# Patient Record
Sex: Male | Born: 1960 | Race: White | Hispanic: No | Marital: Single | Smoking: Never smoker
Health system: Southern US, Community
[De-identification: ages and names within clinical notes are randomized; demographics above are authoritative.]

## PROBLEM LIST (undated history)

## (undated) DIAGNOSIS — E785 Hyperlipidemia, unspecified: Secondary | ICD-10-CM

## (undated) DIAGNOSIS — N200 Calculus of kidney: Secondary | ICD-10-CM

## (undated) DIAGNOSIS — B20 Human immunodeficiency virus [HIV] disease: Secondary | ICD-10-CM

## (undated) HISTORY — DX: Human immunodeficiency virus (HIV) disease: B20

## (undated) HISTORY — PX: APPENDECTOMY: SHX54

## (undated) HISTORY — DX: Calculus of kidney: N20.0

## (undated) HISTORY — DX: Hyperlipidemia, unspecified: E78.5

---

## 2003-08-02 ENCOUNTER — Other Ambulatory Visit: Payer: Self-pay

## 2007-04-12 ENCOUNTER — Emergency Department: Payer: Self-pay | Admitting: Emergency Medicine

## 2007-04-16 ENCOUNTER — Ambulatory Visit: Payer: Self-pay | Admitting: Urology

## 2008-04-05 ENCOUNTER — Ambulatory Visit: Payer: Self-pay | Admitting: Urology

## 2008-05-28 DIAGNOSIS — B20 Human immunodeficiency virus [HIV] disease: Secondary | ICD-10-CM

## 2008-05-28 HISTORY — DX: Human immunodeficiency virus (HIV) disease: B20

## 2008-06-21 ENCOUNTER — Ambulatory Visit: Payer: Self-pay | Admitting: Urology

## 2008-06-23 ENCOUNTER — Ambulatory Visit: Payer: Self-pay | Admitting: Urology

## 2008-06-24 ENCOUNTER — Ambulatory Visit: Payer: Self-pay | Admitting: Urology

## 2008-07-01 ENCOUNTER — Ambulatory Visit: Payer: Self-pay | Admitting: Urology

## 2008-09-29 ENCOUNTER — Inpatient Hospital Stay: Payer: Self-pay | Admitting: Internal Medicine

## 2008-10-06 ENCOUNTER — Ambulatory Visit: Payer: Self-pay | Admitting: Urology

## 2008-10-11 ENCOUNTER — Encounter: Payer: Self-pay | Admitting: Infectious Diseases

## 2008-12-14 ENCOUNTER — Encounter: Payer: Self-pay | Admitting: Infectious Diseases

## 2008-12-14 LAB — CONVERTED CEMR LAB
CD4 Count: 290 microliters
CD4 T Helper %: 13.8 %
Cholesterol: 163 mg/dL
Glucose, Bld: 65 mg/dL
HIV 1 RNA Quant: <48 copies/mL
Hemoglobin: 15.3 g/dL

## 2009-03-16 ENCOUNTER — Encounter: Payer: Self-pay | Admitting: Infectious Diseases

## 2009-03-16 LAB — CONVERTED CEMR LAB
CD4 T Helper %: 17.6 %
Glucose, Bld: 88 mg/dL
HIV 1 RNA Quant: <20 copies/mL
Hemoglobin: 15.6 g/dL
WBC: 4.1 10*3/uL

## 2009-06-16 ENCOUNTER — Encounter: Payer: Self-pay | Admitting: Infectious Diseases

## 2009-06-16 LAB — CONVERTED CEMR LAB
CD4 T Helper %: 20.1 %
Platelets: 209 10*3/uL
WBC: 4.7 10*3/uL

## 2009-09-15 ENCOUNTER — Encounter: Payer: Self-pay | Admitting: Infectious Diseases

## 2009-09-15 LAB — CONVERTED CEMR LAB
HIV 1 RNA Quant: <20 copies/mL
Platelets: 248 10*3/uL

## 2009-10-05 ENCOUNTER — Ambulatory Visit: Payer: Self-pay | Admitting: Urology

## 2009-10-19 ENCOUNTER — Encounter: Payer: Self-pay | Admitting: Infectious Diseases

## 2009-10-25 ENCOUNTER — Encounter: Payer: Self-pay | Admitting: Infectious Diseases

## 2009-10-25 ENCOUNTER — Ambulatory Visit: Payer: Self-pay | Admitting: Infectious Diseases

## 2009-10-25 DIAGNOSIS — B20 Human immunodeficiency virus [HIV] disease: Secondary | ICD-10-CM | POA: Insufficient documentation

## 2009-10-25 LAB — CONVERTED CEMR LAB
ALT: 35 units/L (ref 0–35)
AST: 22 units/L (ref 0–37)
Basophils Absolute: 0 10*3/uL (ref 0.0–0.1)
Basophils Relative: 1 % (ref 0–1)
Cholesterol: 137 mg/dL (ref 0–200)
Creatinine, Ser: 1 mg/dL (ref 0.40–1.20)
Eosinophils Absolute: 0.1 10*3/uL (ref 0.0–0.7)
GC Probe Amp, Urine: NEGATIVE
HDL: 33 mg/dL — ABNORMAL LOW (ref >39)
HIV-2 Ab: NEGATIVE
HIV: REACTIVE
Hemoglobin, Urine: NEGATIVE
Hep B Core Total Ab: POSITIVE — AB
Hepatitis B Surface Ag: NEGATIVE
Leukocytes, UA: NEGATIVE
MCHC: 34.1 g/dL (ref 30.0–36.0)
MCV: 97.9 fL (ref 78.0–100.0)
Neutrophils Relative %: 52 % (ref 43–77)
Nitrite: NEGATIVE
Platelets: 192 10*3/uL (ref 150–400)
Protein, ur: NEGATIVE mg/dL
RDW: 12.3 % (ref 11.5–15.5)
Total Bilirubin: 0.4 mg/dL (ref 0.3–1.2)
Total CHOL/HDL Ratio: 4.2
VLDL: 54 mg/dL — ABNORMAL HIGH (ref 0–40)
WBC: 4 10*3/uL (ref 4.0–10.5)
pH: 6.5 (ref 5.0–8.0)

## 2009-10-26 DIAGNOSIS — N200 Calculus of kidney: Secondary | ICD-10-CM | POA: Insufficient documentation

## 2009-10-26 DIAGNOSIS — F329 Major depressive disorder, single episode, unspecified: Secondary | ICD-10-CM

## 2009-11-08 ENCOUNTER — Ambulatory Visit: Payer: Self-pay | Admitting: Infectious Diseases

## 2009-11-22 ENCOUNTER — Encounter (INDEPENDENT_AMBULATORY_CARE_PROVIDER_SITE_OTHER): Payer: Self-pay | Admitting: *Deleted

## 2010-02-07 ENCOUNTER — Ambulatory Visit: Payer: Self-pay | Admitting: Infectious Diseases

## 2010-02-07 LAB — CONVERTED CEMR LAB
ALT: 27 units/L (ref 0–35)
Albumin: 4.5 g/dL (ref 3.5–5.2)
CO2: 26 meq/L (ref 19–32)
Calcium: 9.4 mg/dL (ref 8.4–10.5)
Chloride: 103 meq/L (ref 96–112)
Eosinophils Relative: 2 % (ref 0–5)
Glucose, Bld: 82 mg/dL (ref 70–99)
HCT: 46.3 % — ABNORMAL HIGH (ref 36.0–46.0)
HIV 1 RNA Quant: <20 copies/mL (ref <20)
HIV-1 RNA Quant, Log: <1.3 (ref <1.30)
Lymphocytes Relative: 30 % (ref 12–46)
Lymphs Abs: 1.4 10*3/uL (ref 0.7–4.0)
Neutro Abs: 2.7 10*3/uL (ref 1.7–7.7)
Platelets: 203 10*3/uL (ref 150–400)
Potassium: 4.2 meq/L (ref 3.5–5.3)
Sodium: 139 meq/L (ref 135–145)
Total Bilirubin: 0.7 mg/dL (ref 0.3–1.2)
Total Protein: 7.9 g/dL (ref 6.0–8.3)
WBC: 4.7 10*3/uL (ref 4.0–10.5)

## 2010-02-20 ENCOUNTER — Ambulatory Visit: Payer: Self-pay | Admitting: Infectious Diseases

## 2010-02-20 ENCOUNTER — Encounter (INDEPENDENT_AMBULATORY_CARE_PROVIDER_SITE_OTHER): Payer: Self-pay | Admitting: *Deleted

## 2010-02-20 DIAGNOSIS — E785 Hyperlipidemia, unspecified: Secondary | ICD-10-CM | POA: Insufficient documentation

## 2010-06-19 ENCOUNTER — Ambulatory Visit
Admission: RE | Admit: 2010-06-19 | Discharge: 2010-06-19 | Payer: Self-pay | Source: Home / Self Care | Attending: Infectious Diseases | Admitting: Infectious Diseases

## 2010-06-19 ENCOUNTER — Encounter: Payer: Self-pay | Admitting: Infectious Diseases

## 2010-06-19 LAB — CONVERTED CEMR LAB
ALT: 36 units/L — ABNORMAL HIGH (ref 0–35)
AST: 25 units/L (ref 0–37)
Alkaline Phosphatase: 128 units/L — ABNORMAL HIGH (ref 39–117)
BUN: 15 mg/dL (ref 6–23)
Basophils Relative: 0 % (ref 0–1)
Creatinine, Ser: 1.02 mg/dL (ref 0.40–1.20)
HCT: 44.1 % (ref 36.0–46.0)
HDL: 36 mg/dL — ABNORMAL LOW (ref >39)
HIV-1 RNA Quant, Log: <1.3 (ref <1.30)
Hemoglobin: 15.3 g/dL — ABNORMAL HIGH (ref 12.0–15.0)
LDL Cholesterol: 71 mg/dL (ref 0–99)
MCV: 96.1 fL (ref 78.0–100.0)
Monocytes Relative: 11 % (ref 3–12)
Neutro Abs: 2.2 10*3/uL (ref 1.7–7.7)
Neutrophils Relative %: 49 % (ref 43–77)
Platelets: 185 10*3/uL (ref 150–400)
RDW: 12.4 % (ref 11.5–15.5)
Total Bilirubin: 0.4 mg/dL (ref 0.3–1.2)
Triglycerides: 195 mg/dL — ABNORMAL HIGH (ref <150)

## 2010-06-27 NOTE — Miscellaneous (Signed)
Summary: Orders Update  Clinical Lists Changes  Orders: Added new Service order of Influenza Vaccine NON MCR (60454) - Signed Added new Service order of Pneumococcal Vaccine (09811) - Signed Added new Service order of Admin 1st Vaccine (91478) - Signed Added new Service order of Admin 1st Vaccine [facility]) [phone]) - Signed Observations: Added new observation of PNEUMOVAXVIS: 05/02/09 version given February 20, 2010. (02/20/2010 9:38) Added new observation of PNEUMOVAXLOT: [id number] (02/20/2010 9:38) Added new observation of PNEUMOVAXEXP: 08/12/2011 (02/20/2010 9:38) Added new observation of PNEUMOVAXBY: Kathi Simpers [facility]) (02/20/2010 9:38) Added new observation of PNEUMOVAXRTE: IM (02/20/2010 9:38) Added new observation of PNEUMOVAXMFR: Merck (02/20/2010 9:38) Added new observation of PNEUMOVAXSIT: left deltoid (02/20/2010 9:38) Added new observation of PNEUMOVAX: Pneumovax (02/20/2010 9:38) Added new observation of FLU VAX#1VIS: 12/20/09 version given February 20, 2010. (02/20/2010 9:38) Added new observation of FLU VAXLOT: 11033P (02/20/2010 9:38) Added new observation of FLU VAX EXP: 08/27/2010 (02/20/2010 9:38) Added new observation of FLU VAXBY: Kathi Simpers [facility]) (02/20/2010 9:38) Added new observation of FLU VAXRTE: IM (02/20/2010 9:38) Added new observation of FLU VAX DSE: 0.5 ml (02/20/2010 9:38) Added new observation of FLU VAXMFR: Novartis (02/20/2010 9:38) Added new observation of FLU VAX SITE: right deltoid (02/20/2010 9:38) Added new observation of FLU VAX: Fluvax Non-MCR (02/20/2010 9:38)      Pneumovax Vaccine    Vaccine Type: Pneumovax    Site: left deltoid    Mfr: Merck    Dose: 0.5 ml    Route: IM    Given by: Kathi Simpers CMA(AAMA)    Exp. Date: 08/12/2011    Lot #: [id number]    VIS given: 05/02/09 version given February 20, 2010.  Influenza Vaccine    Vaccine Type: Fluvax Non-MCR    Site: right deltoid    Mfr: Novartis    Dose: 0.5 ml    Route: IM  Given by: Kathi Simpers CMA(AAMA)    Exp. Date: 08/27/2010    Lot #: [id number]    VIS given: 12/20/09 version given February 20, 2010.  Flu Vaccine Consent Questions    Do you have a history of severe allergic reactions to this vaccine? no    Any prior history of allergic reactions to egg and/or gelatin? no    Do you have a sensitivity to the preservative Thimersol? no    Do you have a past history of Guillan-Barre Syndrome? no    Do you currently have an acute febrile illness? no    Have you ever had a severe reaction to latex? no    Vaccine information given and explained to patient? yes    Are you currently pregnant? no

## 2010-06-27 NOTE — Miscellaneous (Signed)
Summary: clinical update/ryan white  Clinical Lists Changes  Observations: Added new observation of PAYOR: Private (11/22/2009 8:52) Added new observation of AIDSDAP: Pending (11/22/2009 8:52) Added new observation of PCTFPL: 94.63  (11/22/2009 8:52) Added new observation of INCOMESOURCE: Unemployment  (11/22/2009 8:52) Added new observation of HOUSEINCOME: 16109  (11/22/2009 8:52) Added new observation of #CHILD<18 IN: No  (11/22/2009 8:52) Added new observation of FAMILYSIZE: 1  (11/22/2009 8:52) Added new observation of HOUSING: Stable/permanent  (11/22/2009 8:52) Added new observation of FINASSESSDT: 11/22/2009  (11/22/2009 8:52) Added new observation of YEARLYEXPEN: 360  (11/22/2009 8:52) Added new observation of HIV INIT DX: 09/27/2008  (11/22/2009 8:52) Added new observation of HIV RISK BEH: MSM  (11/22/2009 8:52) Added new observation of GENDER: Male  (11/22/2009 8:52) Added new observation of LATINO/HISP: No  (11/22/2009 8:52) Added new observation of RACE: White  (11/22/2009 8:52) Added new observation of REC_MESSAGE: Yes  (11/22/2009 8:52) Added new observation of RECPHONECALL: Yes  (11/22/2009 8:52) Added new observation of REC_MAIL: Yes  (11/22/2009 8:52) Added new observation of RW VITAL STA: Active  (11/22/2009 8:52) Added new observation of PATNTCOUNTY: [place]  (11/22/2009 8:52) Added new observation of RWPARTICIP: Yes  (11/22/2009 8:52)

## 2010-06-27 NOTE — Miscellaneous (Signed)
Summary: Orders Update  Clinical Lists Changes  Orders: Added new Test order of T-Comprehensive Metabolic Panel ([id number]) - Signed Added new Test order of T-CBC w/Diff (85025-10010) - Signed Added new Test order of T-CD4SP ([facility]) (CD4SP) - Signed Added new Test order of T-HIV Viral Load (87536-83319) - Signed 

## 2010-06-27 NOTE — Assessment & Plan Note (Signed)
Summary: NEW 042/   CC:  New patient.  History of Present Illness: 50 yo M with hx of AIDS dx 08-2008 in [facility] (CD4 84).He thought he had "flu"  at the time. It progressed and he was thought to have kidney stone. He had what sounds like bacteremia due to this and he had a HIV+ test then.   He was seen and followed by M. Blocker and now transfers here for care. He was started on atripla and bactrim. He has otherwise felt well.  CD4 280 and VL <48, Trig 269 (10-25-09)  Preventive Screening-Counseling & Management  Alcohol-Tobacco     Alcohol drinks/day: 0     Smoking Status: never  Caffeine-Diet-Exercise     Caffeine use/day: tea     Does Patient Exercise: yes     Type of exercise: walking 2.6 miles     Exercise (avg: min/session): 30-60     Times/week: 3  Safety-Violence-Falls     Seat Belt Use: yes      Drug Use:  no.     Current Allergies (reviewed today): ! CODEINE Past History:  Past Medical History: Nephrolithiasis Depression/Panic Attacks AIDS dx 08-2008  Current Medications (verified): 1)  Atripla 600-200-300 Mg Tabs (Efavirenz-Emtricitab-Tenofovir) .... Take 1 Tablet By Mouth At Bedtime 2)  Effexor Xr 75 Mg Xr24h-Cap (Venlafaxine Hcl) .... Take 1 Capsule By Mouth Once A Day Per Dr. Leavy Cella  Allergies: 1)  ! 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         has started to work out at then gym to try and lose wt. walking 13miles/day. oocas sleep walking. No panic atacks x 2 years  Vital Signs:  Patient profile:   50 year old male Height:      70 inches (177.80 cm) Weight:      214.12 pounds (97.33 kg) BMI:     30.83 Temp:     98.3 degrees F (36.83 degrees C) oral Pulse rate:   73 / minute BP sitting:   122 / 84  (left  arm)  Vitals Entered By: Baxter Hire) (November 08, 2009 2:01 PM) CC: New patient Pain Assessment Patient in pain? no      Nutritional Status Detail appetite is good per patient  Have you ever been in a relationship where you felt threatened, hurt or afraid?Unable to ask-friend in the room   Does patient need assistance? Functional Status Self care Ambulation Normal        Medication Adherence: 11/08/2009   Adherence to medications reviewed with patient. Counseling to provide adequate adherence provided   Prevention For Positives: 11/08/2009   Safe sex practices discussed with patient. Condoms offered.                             Physical Exam  General:  well-developed, well-nourished, and well-hydrated.   Eyes:  pupils equal, pupils round, and pupils reactive to light.  some irreguarity of R cornea from prev laser surgery. Mouth:  pharynx pink and moist and no exudates.   Neck:  no masses.   Lungs:  normal respiratory effort and normal breath sounds.   Heart:  normal rate, regular rhythm, and no murmur.   Abdomen:  soft, non-tender, and normal bowel sounds.  Extremities:  no edema Neurologic:  sensation intact to light touch.     Impression & Recommendations:  Problem # 1:  HIV INFECTION (ICD-042)  He is doing well, kudos to Dr Leavy Cella. He is looking to transfer his care to a [employer] provider given that he has lost his job. I will have him meet with our ADAP coordinator as well as have him plugged into the case managers here. He is offered condoms. Will offer him the Hepatitis A series.  Will return to clinic 3 months.   Orders: Consultation Level IV (84132)  Problem # 2:  DEPRESSION (ICD-311) this appears to be well controlled. He can f/u with Dr Judithann Sheen or here, will leave it up to pt.  Her updated medication list for this problem includes:    Effexor Xr 75 Mg Xr24h-cap (Venlafaxine hcl) .Marland Kitchen... Take 1 capsule by mouth once a day per dr.  blocker  Medications Added to Medication List This Visit: 1)  Atripla 600-200-300 Mg Tabs (Efavirenz-emtricitab-tenofovir) .... Take 1 tablet by mouth at bedtime   Appended Document: Orders Update    Clinical Lists Changes  Orders: Added new Service order of Hepatitis A Vaccine (Adult Dose) (44010) - Signed Added new Service order of Admin 1st Vaccine (27253) - Signed Added new Service order of Admin 1st Vaccine [facility]) [phone]) - Signed Observations: Added new observation of HEPAVAX#1VIS: 08/15/04 version given November 08, 2009. (11/08/2009 16:39) Added new observation of HEPAVAX#1LOT: [id number] (11/08/2009 16:39) Added new observation of HEPAVAX#1EXP: 07/30/2011 (11/08/2009 16:39) Added new observation of HEPAVAX#1 BY: Kathi Simpers [facility]) (11/08/2009 16:39) Added new observation of HEPAVAX#1RTE: IM (11/08/2009 16:39) Added new observation of HEPAVAX#1DOS: 1.0 ml (11/08/2009 16:39) Added new observation of HEPAVAX#1MFR: Havrix (11/08/2009 16:39) Added new observation of HEPAVAX#1SIT: right deltoid (11/08/2009 16:39) Added new observation of HEPAVAX #1: HepA (11/08/2009 16:39)       Hepatitis A Vaccine # 1    Vaccine Type: HepA    Site: right deltoid    Mfr: Havrix    Dose: 1.0 ml    Route: IM    Given by: Kathi Simpers CMA(AAMA)    Exp. Date: 07/30/2011    Lot #: [id number]    VIS given: 08/15/04 version given November 08, 2009.

## 2010-06-27 NOTE — Assessment & Plan Note (Signed)
Summary: F/U/VS   CC:  3 month follow up.  History of Present Illness: 50 yo M with hx of hyperlipidemia and AIDS dx 08-2008 in [facility] (CD4 84). He thought he had "flu" at the time. It progressed and he was thought to have kidney stone. He had what sounds like bacteremia due to this and he had a HIV+ test then.   He was seen and followed by M. Blocker and now transfers here for care. He was started on atripla and bactrim.  feeling well. occas pain in his R 3rd digit in AM. pops. no hx of truama.  eventually it loosesns up but still pops all the time. was tapered off effexor but then restarted after he had a panic episode.  CD4 350  and VL <20, Cr 1.27 (02-07-10)  Preventive Screening-Counseling & Management  Alcohol-Tobacco     Alcohol drinks/day: 0     Smoking Status: never  Caffeine-Diet-Exercise     Caffeine use/day: tea     Does Patient Exercise: yes     Type of exercise: walking 2.6 miles     Exercise (avg: min/session): 30-60     Times/week: 3  Safety-Violence-Falls     Seat Belt Use: yes   Updated Prior Medication List: ATRIPLA 600-200-300 MG TABS (EFAVIRENZ-EMTRICITAB-TENOFOVIR) Take 1 tablet by mouth at bedtime EFFEXOR XR 75 MG XR24H-CAP (VENLAFAXINE HCL) Take 1 capsule by mouth once a day per Dr. Leavy Cella  Current Allergies (reviewed today): ! CODEINE Past History:  Past medical, surgical, family and social histories (including risk factors) reviewed, and no changes noted (except as noted below).  Past Medical History: Reviewed history from 11/08/2009 and no changes required. Nephrolithiasis Depression/Panic Attacks AIDS dx 08-2008  Family History: Reviewed history from 11/08/2009 and no changes required. CAD- maternal-grandfather, uncle,   Social History: Reviewed history from 11/08/2009 and no changes required. Domestic Partner Never Smoked Alcohol use-rare Drug use-no Occupation: Engineer, water  Review of Systems       walking every  day, not eating junk food only sort of watching what he eats. nl BM, nl urination  Vital Signs:  Patient profile:   50 year old male Height:      70 inches (177.80 cm) Weight:      215.8 pounds (98.09 kg) BMI:     31.08 Temp:     97.9 degrees F (36.61 degrees C) oral Pulse rate:   73 / minute BP sitting:   106 / 76  (left arm)  Vitals Entered By: Kathi Simpers CMA(AAMA) (February 20, 2010 9:00 AM) CC: 3 month follow up Pain Assessment Patient in pain? yes     Location: right hand Intensity: 6 Type: aching Onset of pain  pain only happens in the morning and makes a popping sound Nutritional Status BMI of > 30 = obese Nutritional Status Detail appetite is great per patient  Have you ever been in a relationship where you felt threatened, hurt or afraid?No   Does patient need assistance? Functional Status Self care Ambulation Normal   Physical Exam  General:  well-developed, well-nourished, well-hydrated, and overweight-appearing.   Eyes:  pupils equal, pupils round, and pupils reactive to light.   Mouth:  pharynx pink and moist and no exudates.   Neck:  no masses.   Lungs:  normal respiratory effort and normal breath sounds.   Heart:  normal rate, regular rhythm, and no murmur.   Abdomen:  soft, non-tender, and normal bowel sounds.  Medication Adherence: 02/20/2010   Adherence to medications reviewed with patient. Counseling to provide adequate adherence provided   Prevention For Positives: 02/20/2010   Safe sex practices discussed with patient. Condoms offered.                             Impression & Recommendations:  Problem # 1:  HIV INFECTION (ICD-042) doing well. not clear about his arthritis. offered condoms., will give flu and pnvx. offered condoms. return to clinic 5 months with labs prior.  Orders: Est. Patient Level IV (99214)Future Orders: T-CD4SP ([facility]) (CD4SP) ... 08/19/2010 T-HIV Viral Load [phone]) ... 08/19/2010 T-HIV  Genotype (30160-10932) ... 08/19/2010 T-CBC w/Diff (35573-22025) ... 08/19/2010 T-RPR (Syphilis) [phone]) ... 08/19/2010 T-Lipid Profile [phone]) ... 08/19/2010  Problem # 2:  DEPRESSION (ICD-311) he will cont to f/u with his mental health counselor.  Her updated medication list for this problem includes:    Effexor Xr 75 Mg Xr24h-cap (Venlafaxine hcl) .Marland Kitchen... Take 1 capsule by mouth once a day per dr. blocker  Problem # 3:  HYPERLIPIDEMIA (ICD-272.4) wil lcont ot encourage him to exercise (for physical and mental health) and watch his diet. recheck lipids at f/u visit.

## 2010-06-27 NOTE — Consult Note (Signed)
Summary: [facility]   Imported By: Florinda Marker 01/20/2010 15:24:18  _____________________________________________________________________  External Attachment:    Type:   Image     Comment:   External Document

## 2010-06-27 NOTE — Miscellaneous (Signed)
Summary: New 042 orders/tkk  Clinical Lists Changes  Problems: Added new problem of HIV INFECTION (ICD-042) Added new problem of DEPRESSION (ICD-311) Added new problem of NEPHROLITHIASIS (ICD-592.0) Medications: Added new medication of ATRIPLA 600-200-300 MG TABS (EFAVIRENZ-EMTRICITAB-TENOFOVIR) Take 1 tablet by mouth at bedtime per Dr. Leavy Cella Added new medication of SEPTRA DS 800-160 MG TABS (SULFAMETHOXAZOLE-TRIMETHOPRIM) Take 1 tablet by mouth once a day per Dr. Leavy Cella Added new medication of ZANTAC 150 MG TABS (RANITIDINE HCL) Take 1 tablet by mouth two times a day per Dr. Leavy Cella Added new medication of EFFEXOR XR 75 MG XR24H-CAP (VENLAFAXINE HCL) Take 1 capsule by mouth once a day per Dr. Leavy Cella Orders: Added new Test order of T-Comprehensive Metabolic Panel [phone]) - Signed Added new Test order of T-CBC w/Diff [phone]) - Signed Added new Test order of T-CD4SP [facility]) (CD4SP) - Signed Added new Test order of T-Chlamydia  Probe, urine [id number]) - Signed Added new Test order of T-GC Probe, urine [phone]) - Signed Added new Test order of T-Hepatitis B Surface Antigen [phone]) - Signed Added new Test order of T-Hepatitis B Surface Antibody [phone]) - Signed Added new Test order of T-Hepatitis B Core Antibody ([id number]) - Signed Added new Test order of T-Hepatitis C Antibody ([id number]) - Signed Added new Test order of T-HIV Antibody  (Reflex) [phone]) - Signed Added new Test order of T-HIV Ab Confirmatory Test/Western Blot ([id number]) - Signed Added new Test order of T-Lipid Profile (35573-22025) - Signed Added new Test order of T-RPR (Syphilis) [phone]) - Signed Added new Test order of T-Urinalysis (83151-76160) - Signed Added new Test order of T-Hepatitis A Antibody ([id number]) - Signed Added new Test order of T-HIV1 Quant rflx Ultra or Genotype ([id number]) - Signed Allergies: Added new allergy or adverse reaction of  CODEINE Observations: Added new observation of NKA: F (10/25/2009 11:47) Added new observation of CREATININE: 1.19 mg/dL (09/38/1829 93:71) Added new observation of PLATELETK/UL: 248 K/uL (09/15/2009 15:02) Added new observation of WBC COUNT: 5.0 10*3/microliter (09/15/2009 15:02) Added new observation of HGB: 15.8 g/dL (69/67/8938 10:17) Added new observation of GLUCOSE SER: 103 mg/dL (51/06/5850 77:82) Added new observation of HIV1RNA QA: < 20 copies/mL (09/15/2009 15:02) Added new observation of T-HELPER %: 21.2 % (09/15/2009 15:02) Added new observation of CD4 COUNT: 276 microliters (09/15/2009 15:02) Added new observation of PLATELETK/UL: 209 K/uL (06/16/2009 15:00) Added new observation of WBC COUNT: 4.7 10*3/microliter (06/16/2009 15:00) Added new observation of HGB: 15.7 g/dL (42/35/3614 43:15) Added new observation of HIV1RNA QA: < 20 copies/mL (06/16/2009 15:00) Added new observation of T-HELPER %: 20.1 % (06/16/2009 15:00) Added new observation of CD4 COUNT: 322 microliters (06/16/2009 15:00) Added new observation of CREATININE: 1.08 mg/dL (40/12/6759 95:09) Added new observation of PLATELETK/UL: 192 K/uL (03/16/2009 14:58) Added new observation of WBC COUNT: 4.1 10*3/microliter (03/16/2009 14:58) Added new observation of HGB: 15.6 g/dL (32/67/1245 80:99) Added new observation of GLUCOSE SER: 88 mg/dL (83/38/2505 39:76) Added new observation of WEIGHT: 215 lb (03/16/2009 14:58) Added new observation of HIV1RNA QA: < 20 copies/mL (03/16/2009 14:58) Added new observation of T-HELPER %: 17.6 % (03/16/2009 14:58) Added new observation of CD4 COUNT: 264 microliters (03/16/2009 14:58) Added new observation of TRIGLYCERIDE: 252 mg/dL (73/41/9379 02:40) Added new observation of CHOLESTEROL: 163 mg/dL (97/35/3299 24:26) Added new observation of CREATININE: 1.12 mg/dL (83/41/9622 29:79) Added new observation of PLATELETK/UL: 209 K/uL (12/14/2008 14:54) Added new observation of WBC  COUNT: 5.1 10*3/microliter (12/14/2008 14:54) Added new observation of HGB: 15.3 g/dL ([birth year]/1941 74:08) Added new observation of GLUCOSE  SER: 65 mg/dL (28/41/3244 01:02) Added new observation of WEIGHT: 208 lb (12/14/2008 14:54) Added new observation of HIV1RNA QA: < 48 copies/mL (12/14/2008 14:54) Added new observation of T-HELPER %: 13.8 % (12/14/2008 14:54) Added new observation of CD4 COUNT: 290 microliters (12/14/2008 14:54) Added new observation of WEIGHT: 196 lb (10/11/2008 14:55) Added new observation of CD4 COUNT: 84 microliters (10/11/2008 14:49)        -  Date:  09/15/2009    CD4: 276    CD4%: 21.2    Viral Load: < 20    Glucose: 103    Hemoglobin: 15.8    WBC: 5.0    Platelets: 248    Creatinine: 1.19  Date:  06/16/2009    CD4: 322    CD4%: 20.1    Viral Load: < 20    Hemoglobin: 15.7    WBC: 4.7    Platelets: 209  Date:  03/16/2009    CD4: 264    CD4%: 17.6    Viral Load: < 20    Weight: 215    Glucose: 88    Hemoglobin: 15.6    WBC: 4.1    Platelets: 192    Creatinine: 1.08  Date:  12/14/2008    CD4: 290    CD4%: 13.8    Viral Load: < 48    Weight: 208    Glucose: 65    Hemoglobin: 15.3    WBC: 5.1    Platelets: 209    Creatinine: 1.12    Cholesterol: 163    Triglycerides: 252  Date:  10/11/2008    CD4: 84    Weight: 196

## 2010-06-29 NOTE — Miscellaneous (Addendum)
Summary: labs  Clinical Lists Changes  Orders: Added new Test order of T-Comprehensive Metabolic Panel [id number]) - Signed     Process Orders Check Orders Results:     [facility]: ABN not required for this insurance Order queued for requisitioning for [facility]: June 19, 2010 2:32 PM  Tests Sent for requisitioning (June 19, 2010 2:32 PM):     06/19/2010: [facility] -- T-Comprehensive Metabolic Panel [id number] (signed)

## 2010-07-05 ENCOUNTER — Ambulatory Visit (INDEPENDENT_AMBULATORY_CARE_PROVIDER_SITE_OTHER): Payer: PRIVATE HEALTH INSURANCE | Admitting: Infectious Diseases

## 2010-07-05 ENCOUNTER — Encounter: Payer: Self-pay | Admitting: Infectious Diseases

## 2010-07-05 DIAGNOSIS — Z23 Encounter for immunization: Secondary | ICD-10-CM

## 2010-07-05 DIAGNOSIS — B20 Human immunodeficiency virus [HIV] disease: Secondary | ICD-10-CM

## 2010-07-13 NOTE — Assessment & Plan Note (Signed)
Summary: F/U   Vital Signs:  Patient profile:   50 year old male Height:      70 inches (177.80 cm) Weight:      222.8 pounds (101.27 kg) BMI:     32.08 Temp:     98.3 degrees F (36.83 degrees C) oral Pulse rate:   99 / minute BP sitting:   131 / 87  (right arm)  Vitals Entered By: Wendall Mola CMA Duncan Dull) (July 05, 2010 2:42 PM) CC: follow-up visit, lab results Is Patient Diabetic? No Pain Assessment Patient in pain? no      Nutritional Status BMI of > 30 = obese Nutritional Status Detail appetite "good"  Have you ever been in a relationship where you felt threatened, hurt or afraid?No   Does patient need assistance? Functional Status Self care Ambulation Normal Comments no missed doses of  Atripla per pt.   CC:  follow-up visit and lab results.  History of Present Illness: 50 yo M with hx of hyperlipidemia and AIDS dx 08-2008 in [facility] (CD4 84). He thought he had "flu" at the time. It progressed and he was thought to have kidney stone. He had what sounds like bacteremia due to this and he had a HIV+ test then.   He was seen and followed by M. Blocker and now transfers here for care. He was started on atripla and bactrim. Now on atripla alone.  his previous R 3rd digit "popping" and "catching" has improved.   CD4 370  and VL <20, Cr 1.02 (06-19-10)  Preventive Screening-Counseling & Management  Alcohol-Tobacco     Alcohol drinks/day: 0     Smoking Status: never  Caffeine-Diet-Exercise     Caffeine use/day: tea     Does Patient Exercise: yes     Type of exercise: walking 2.6 miles     Exercise (avg: min/session): 30-60     Times/week: 3  Safety-Violence-Falls     Seat Belt Use: yes      Drug Use:  no.    Comments: pt. declined condoms  Current Medications (verified): 1)  Atripla 600-200-300 Mg Tabs (Efavirenz-Emtricitab-Tenofovir) .... Take 1 Tablet By Mouth At Bedtime 2)  Effexor Xr 75 Mg Xr24h-Cap (Venlafaxine Hcl) .... Take 1  Capsule By Mouth Once A Day Per Dr. Leavy Cella  Allergies: 1)  ! Codeine  Past History:  Past medical, surgical, family and social histories (including risk factors) reviewed, and no changes noted (except as noted below).  Past Medical History: Reviewed history from 11/08/2009 and no changes required. Nephrolithiasis Depression/Panic Attacks AIDS dx 08-2008  Family History: Reviewed history from 11/08/2009 and no changes required. CAD- maternal-grandfather, uncle,   Social History: Reviewed history from 11/08/2009 and no changes required. Domestic Partner Never Smoked Alcohol use-rare Drug use-no Occupation: Engineer, water  Review of Systems       wt up 7#. realizes he is going to loose the wt now.   Physical Exam  General:  well-developed, well-nourished, and well-hydrated.   Eyes:  pupils equal, pupils round, and pupils reactive to light.   Mouth:  pharynx pink and moist and no exudates.   Neck:  no masses.   Lungs:  normal respiratory effort and normal breath sounds.   Heart:  normal rate, regular rhythm, and no murmur.   Abdomen:  soft, non-tender, and normal bowel sounds.     Impression & Recommendations:  Problem # 1:  HIV INFECTION (ICD-042) doing very well. will cont his current meds. he is  meeting with ADAP coordinator to try and get his application straightened out (done when he was seeing Dr Leavy Cella and he has not heard anything since). offered condoms, return to clinic 4-5 months.  Orders: Est. Patient Level IV (99214)Future Orders: T-CD4SP ([facility]) (CD4SP) ... 10/03/2010 T-HIV Viral Load [phone]) ... 10/03/2010 T-Comprehensive Metabolic Panel [id number]) ... 10/03/2010 T-CBC w/Diff ([id number]) ... 10/03/2010  Problem # 2:  DEPRESSION (ICD-311) will cont effexor, will cont to f/u with Dr Judithann Sheen.  Her updated medication list for this problem includes:    Effexor Xr 75 Mg Xr24h-cap (Venlafaxine hcl) .Marland Kitchen... Take 1 capsule by mouth once a day per  dr. blocker  Other Orders: Hepatitis A Vaccine (Adult Dose) (57846) Admin 1st Vaccine (96295)    Orders Added: 1)  Hepatitis A Vaccine (Adult Dose) [90632] 2)  Admin 1st Vaccine [90471] 3)  T-CD4SP [facility]) [CD4SP] 4)  T-HIV Viral Load [phone] 5)  T-Comprehensive Metabolic Panel [[id number]] 6)  T-CBC w/Diff [[id number]] 7)  Est. Patient Level IV [03474]   Immunizations Administered:  Hepatitis A Vaccine # 2:    Vaccine Type: HepA    Site: right deltoid    Mfr: GlaxoSmithKline    Dose: 0.5 ml    Route: IM    Given by: Wendall Mola CMA ( AAMA)    Exp. Date: 05/10/2012    Lot #: [id number]    VIS given: 08/15/04 version given July 05, 2010.  PPD Results    Date of reading: 10/06/2008    Results: < 5mm    Interpretation: negative   Immunizations Administered:  Hepatitis A Vaccine # 2:    Vaccine Type: HepA    Site: right deltoid    Mfr: GlaxoSmithKline    Dose: 0.5 ml    Route: IM    Given by: Wendall Mola CMA ( AAMA)    Exp. Date: 05/10/2012    Lot #: [id number]    VIS given: 08/15/04 version given July 05, 2010.        Medication Adherence: 07/05/2010   Adherence to medications reviewed with patient. Counseling to provide adequate adherence provided   Prevention For Positives: 07/05/2010   Safe sex practices discussed with patient. Condoms offered.

## 2010-08-10 LAB — T-HELPER CELL (CD4) - (RCID CLINIC ONLY): CD4 T Cell Abs: 350 uL — ABNORMAL LOW (ref 400–2700)

## 2010-08-14 LAB — T-HELPER CELL (CD4) - (RCID CLINIC ONLY)
CD4 % Helper T Cell: 21 % — ABNORMAL LOW (ref 33–55)
CD4 T Cell Abs: 280 uL — ABNORMAL LOW (ref 400–2700)

## 2010-10-25 ENCOUNTER — Ambulatory Visit: Payer: Self-pay | Admitting: Urology

## 2011-01-17 ENCOUNTER — Other Ambulatory Visit: Payer: Self-pay | Admitting: Infectious Diseases

## 2011-01-17 ENCOUNTER — Other Ambulatory Visit: Payer: PRIVATE HEALTH INSURANCE

## 2011-01-17 DIAGNOSIS — B20 Human immunodeficiency virus [HIV] disease: Secondary | ICD-10-CM

## 2011-01-18 LAB — COMPLETE METABOLIC PANEL WITH GFR
ALT: 37 U/L (ref 0–53)
AST: 26 U/L (ref 0–37)
Creat: 1.19 mg/dL (ref 0.50–1.35)
Total Bilirubin: 0.5 mg/dL (ref 0.3–1.2)

## 2011-01-18 LAB — CBC WITH DIFFERENTIAL/PLATELET
Basophils Absolute: 0 10*3/uL (ref 0.0–0.1)
Basophils Relative: 0 % (ref 0–1)
Hemoglobin: 14.9 g/dL (ref 13.0–17.0)
MCHC: 34.3 g/dL (ref 30.0–36.0)
Neutro Abs: 2.6 10*3/uL (ref 1.7–7.7)
Neutrophils Relative %: 56 % (ref 43–77)
Platelets: 187 10*3/uL (ref 150–400)
RDW: 12.4 % (ref 11.5–15.5)

## 2011-01-18 LAB — T-HELPER CELL (CD4) - (RCID CLINIC ONLY)
CD4 % Helper T Cell: 28 % — ABNORMAL LOW (ref 33–55)
CD4 T Cell Abs: 400 uL (ref 400–2700)

## 2011-01-19 LAB — HIV-1 RNA QUANT-NO REFLEX-BLD
HIV 1 RNA Quant: <20 copies/mL (ref <20)
HIV-1 RNA Quant, Log: <1.3 {Log} (ref <1.30)

## 2011-01-26 ENCOUNTER — Encounter: Payer: Self-pay | Admitting: Infectious Diseases

## 2011-01-26 ENCOUNTER — Ambulatory Visit (INDEPENDENT_AMBULATORY_CARE_PROVIDER_SITE_OTHER): Payer: PRIVATE HEALTH INSURANCE | Admitting: Infectious Diseases

## 2011-01-26 VITALS — BP 129/81 | HR 70 | Temp 97.8°F | Ht 70.0 in | Wt 223.0 lb

## 2011-01-26 DIAGNOSIS — E785 Hyperlipidemia, unspecified: Secondary | ICD-10-CM

## 2011-01-26 DIAGNOSIS — Z113 Encounter for screening for infections with a predominantly sexual mode of transmission: Secondary | ICD-10-CM

## 2011-01-26 DIAGNOSIS — B20 Human immunodeficiency virus [HIV] disease: Secondary | ICD-10-CM

## 2011-01-26 DIAGNOSIS — F329 Major depressive disorder, single episode, unspecified: Secondary | ICD-10-CM

## 2011-01-26 DIAGNOSIS — Z23 Encounter for immunization: Secondary | ICD-10-CM

## 2011-01-26 DIAGNOSIS — Z79899 Other long term (current) drug therapy: Secondary | ICD-10-CM

## 2011-01-26 NOTE — Assessment & Plan Note (Signed)
He continues on his Effexor

## 2011-01-26 NOTE — Assessment & Plan Note (Signed)
Will recheck his labs prior to his f/u visit.

## 2011-01-26 NOTE — Progress Notes (Signed)
  Subjective:    Patient ID: Darrell Estrada, male    DOB: 07-29-1960, 50 y.o.   MRN: [id number]  HPI 50 yo M with hx of hyperlipidemia and AIDS dx 08-2008 in [facility] (CD4 84). He thought he had "flu" at the time. It progressed and he was thought to have kidney stone. He had what sounds like bacteremia due to this and he had a HIV+ test then. He was seen and followed by M. Blocker and now transfers here for care. He was started on atripla.  Has been doing well. No complaints this AM.   CD4 400 and VL <20, Cr 1.19 (01-17-11).     Review of Systems  Constitutional: Negative for fever, chills and unexpected weight change.  Respiratory: Negative for cough and shortness of breath.   Gastrointestinal: Negative for diarrhea and constipation.  has been walking, decreasing food, has gained 30 since on ART.      Objective:   Physical Exam  Constitutional: He appears well-developed and well-nourished.  Eyes: EOM are normal. Pupils are equal, round, and reactive to light.  Neck: Neck supple.  Cardiovascular: Normal rate, regular rhythm and normal heart sounds.   Pulmonary/Chest: Effort normal and breath sounds normal. He has no wheezes.  Abdominal: Soft. Bowel sounds are normal. There is no tenderness.  Musculoskeletal: He exhibits no edema.  Lymphadenopathy:    He has no cervical adenopathy.          Assessment & Plan:

## 2011-01-26 NOTE — Assessment & Plan Note (Signed)
He is doing well, will cont his current ART. Gets flu vax today. Will see him back in 6 months with labs prior. Offered condoms, he already has ( he is in serodiscordant relationship).  suggested that he continue diet and exercise. He has been vaccinated for Hep A . Will meet with Lesle Reek as his insurance is ending.

## 2011-02-06 ENCOUNTER — Other Ambulatory Visit: Payer: Self-pay | Admitting: *Deleted

## 2011-02-06 ENCOUNTER — Ambulatory Visit: Payer: PRIVATE HEALTH INSURANCE

## 2011-02-06 DIAGNOSIS — B20 Human immunodeficiency virus [HIV] disease: Secondary | ICD-10-CM

## 2011-02-06 MED ORDER — EFAVIRENZ-EMTRICITAB-TENOFOVIR 600-200-300 MG PO TABS: 1.0000 | ORAL_TABLET | Freq: Every day | ORAL | Status: DC

## 2011-02-28 ENCOUNTER — Other Ambulatory Visit: Payer: Self-pay | Admitting: *Deleted

## 2011-02-28 DIAGNOSIS — B20 Human immunodeficiency virus [HIV] disease: Secondary | ICD-10-CM

## 2011-02-28 MED ORDER — EFAVIRENZ-EMTRICITAB-TENOFOVIR 600-200-300 MG PO TABS: 1.0000 | ORAL_TABLET | Freq: Every day | ORAL | Status: DC

## 2011-06-15 ENCOUNTER — Ambulatory Visit: Payer: Self-pay

## 2011-06-26 ENCOUNTER — Ambulatory Visit: Payer: Self-pay

## 2011-11-01 ENCOUNTER — Telehealth: Payer: Self-pay | Admitting: *Deleted

## 2011-11-01 NOTE — Telephone Encounter (Signed)
I called him to make an appt. transferred to [facility]. He will be here in July

## 2011-12-03 ENCOUNTER — Other Ambulatory Visit: Payer: Self-pay

## 2011-12-03 DIAGNOSIS — Z113 Encounter for screening for infections with a predominantly sexual mode of transmission: Secondary | ICD-10-CM

## 2011-12-03 DIAGNOSIS — Z79899 Other long term (current) drug therapy: Secondary | ICD-10-CM

## 2011-12-03 DIAGNOSIS — B20 Human immunodeficiency virus [HIV] disease: Secondary | ICD-10-CM

## 2011-12-03 LAB — COMPREHENSIVE METABOLIC PANEL
ALT: 45 U/L (ref 0–53)
AST: 29 U/L (ref 0–37)
CO2: 26 mEq/L (ref 19–32)
Calcium: 9.6 mg/dL (ref 8.4–10.5)
Chloride: 105 mEq/L (ref 96–112)
Sodium: 140 mEq/L (ref 135–145)
Total Bilirubin: 0.4 mg/dL (ref 0.3–1.2)
Total Protein: 7.7 g/dL (ref 6.0–8.3)

## 2011-12-03 LAB — CBC
Hemoglobin: 16.5 g/dL (ref 13.0–17.0)
MCH: 34.2 pg — ABNORMAL HIGH (ref 26.0–34.0)
Platelets: 214 10*3/uL (ref 150–400)
RBC: 4.83 MIL/uL (ref 4.22–5.81)
WBC: 4.8 10*3/uL (ref 4.0–10.5)

## 2011-12-03 LAB — RPR

## 2011-12-03 LAB — LIPID PANEL
Cholesterol: 155 mg/dL (ref 0–200)
VLDL: 43 mg/dL — ABNORMAL HIGH (ref 0–40)

## 2011-12-04 LAB — HIV-1 RNA QUANT-NO REFLEX-BLD
HIV 1 RNA Quant: <20 copies/mL (ref <20)
HIV-1 RNA Quant, Log: <1.3 {Log} (ref <1.30)

## 2011-12-04 LAB — T-HELPER CELL (CD4) - (RCID CLINIC ONLY)
CD4 % Helper T Cell: 26 % — ABNORMAL LOW (ref 33–55)
CD4 T Cell Abs: 360 uL — ABNORMAL LOW (ref 400–2700)

## 2011-12-17 ENCOUNTER — Ambulatory Visit: Payer: Self-pay

## 2011-12-17 ENCOUNTER — Encounter: Payer: Self-pay | Admitting: Infectious Diseases

## 2011-12-17 ENCOUNTER — Ambulatory Visit (INDEPENDENT_AMBULATORY_CARE_PROVIDER_SITE_OTHER): Payer: Self-pay | Admitting: Infectious Diseases

## 2011-12-17 VITALS — BP 131/78 | HR 75 | Temp 98.4°F | Wt 225.0 lb

## 2011-12-17 DIAGNOSIS — E785 Hyperlipidemia, unspecified: Secondary | ICD-10-CM

## 2011-12-17 DIAGNOSIS — B20 Human immunodeficiency virus [HIV] disease: Secondary | ICD-10-CM

## 2011-12-17 DIAGNOSIS — F329 Major depressive disorder, single episode, unspecified: Secondary | ICD-10-CM

## 2011-12-17 NOTE — Assessment & Plan Note (Signed)
He's doing very well. Had a small dip in his CD4 but viral load is still undetectable. We'll continue his current rx. Will watch his lipids, encouraged him to continue to watch his diet, exercise. Offered/refused condoms. Vaccines up to date. Meets with financial counselor to get help with medications. rtc 6 months with labs prior.

## 2011-12-17 NOTE — Assessment & Plan Note (Signed)
His cholesterol is normal, his Trig are up over 200. Will defer to his PCP whom we greatly appreciate partnering with.

## 2011-12-17 NOTE — Progress Notes (Signed)
  Subjective:    Patient ID: Darrell Estrada, male    DOB: [birth year], 51 y.o.   MRN: [id number]  HPI 51 yo M with hx of hyperlipidemia and AIDS dx 08-2008 in [place] [facility] (CD4 84). He thought he had "flu" at the time. It progressed and he was thought to have kidney stone. He had what sounds like bacteremia due to this and he had a HIV+ test then. He was seen and followed by M. Blocker and now transfers here for care. He was started on atripla.  Without complaints. Wt is up 10# over 2 yrs. More active and is cautious about diet.    HIV 1 RNA Quant (copies/mL)  Date Value  12/03/2011 <20   01/17/2011 <20   06/19/2010 <20 copies/mL      CD4 T Cell Abs (cmm)  Date Value  12/03/2011 360*  01/17/2011 400   06/19/2010 370*      Review of Systems  Constitutional: Negative for appetite change and unexpected weight change.  Gastrointestinal: Negative for diarrhea and constipation.  Genitourinary: Negative for dysuria.  Psychiatric/Behavioral: Negative for dysphoric mood.       Objective:   Physical Exam  Constitutional: He appears well-developed and well-nourished.  HENT:  Mouth/Throat: No oropharyngeal exudate.  Eyes: EOM are normal. Pupils are equal, round, and reactive to light.  Cardiovascular: Normal rate, regular rhythm and normal heart sounds.   Pulmonary/Chest: Effort normal and breath sounds normal.  Abdominal: Soft. Bowel sounds are normal. There is no tenderness.  Lymphadenopathy:    He has no cervical adenopathy.          Assessment & Plan:

## 2011-12-17 NOTE — Assessment & Plan Note (Signed)
He's doing well on effexor, would like to stop but has been encouraged to stay on by PCP. His mood has been good. Has good relationship with his partner. Has been helping out with his mom who is having some health issues.

## 2012-03-18 ENCOUNTER — Other Ambulatory Visit: Payer: Self-pay | Admitting: *Deleted

## 2012-03-18 DIAGNOSIS — B20 Human immunodeficiency virus [HIV] disease: Secondary | ICD-10-CM

## 2012-03-18 MED ORDER — EFAVIRENZ-EMTRICITAB-TENOFOVIR 600-200-300 MG PO TABS: 1.0000 | ORAL_TABLET | Freq: Every day | ORAL | Status: DC

## 2012-07-01 ENCOUNTER — Ambulatory Visit: Payer: Self-pay

## 2012-07-18 ENCOUNTER — Telehealth: Payer: Self-pay | Admitting: *Deleted

## 2012-07-18 NOTE — Telephone Encounter (Signed)
Called patient and was able to schedule him a visit with doctor for 07/30/12.

## 2012-07-22 ENCOUNTER — Other Ambulatory Visit (INDEPENDENT_AMBULATORY_CARE_PROVIDER_SITE_OTHER): Payer: Self-pay

## 2012-07-22 DIAGNOSIS — B20 Human immunodeficiency virus [HIV] disease: Secondary | ICD-10-CM

## 2012-07-22 DIAGNOSIS — Z79899 Other long term (current) drug therapy: Secondary | ICD-10-CM

## 2012-07-22 DIAGNOSIS — Z113 Encounter for screening for infections with a predominantly sexual mode of transmission: Secondary | ICD-10-CM

## 2012-07-23 LAB — COMPREHENSIVE METABOLIC PANEL
ALT: 44 U/L (ref 0–53)
AST: 26 U/L (ref 0–37)
Albumin: 4.5 g/dL (ref 3.5–5.2)
CO2: 28 mEq/L (ref 19–32)
Calcium: 9.3 mg/dL (ref 8.4–10.5)
Chloride: 105 mEq/L (ref 96–112)
Potassium: 4.1 mEq/L (ref 3.5–5.3)
Total Protein: 7.3 g/dL (ref 6.0–8.3)

## 2012-07-23 LAB — RPR

## 2012-07-23 LAB — LIPID PANEL: Total CHOL/HDL Ratio: 3.7 Ratio

## 2012-07-23 LAB — CBC
Platelets: 192 10*3/uL (ref 150–400)
RBC: 4.64 MIL/uL (ref 4.22–5.81)
RDW: 12.8 % (ref 11.5–15.5)
WBC: 5 10*3/uL (ref 4.0–10.5)

## 2012-07-23 LAB — T-HELPER CELL (CD4) - (RCID CLINIC ONLY)
CD4 % Helper T Cell: 30 % — ABNORMAL LOW (ref 33–55)
CD4 T Cell Abs: 420 uL (ref 400–2700)

## 2012-07-25 LAB — HIV-1 RNA QUANT-NO REFLEX-BLD: HIV-1 RNA Quant, Log: 1.71 {Log} — ABNORMAL HIGH (ref <1.30)

## 2012-07-30 ENCOUNTER — Ambulatory Visit (INDEPENDENT_AMBULATORY_CARE_PROVIDER_SITE_OTHER): Payer: BC Managed Care – PPO | Admitting: Infectious Diseases

## 2012-07-30 ENCOUNTER — Encounter: Payer: Self-pay | Admitting: Infectious Diseases

## 2012-07-30 VITALS — BP 124/81 | HR 76 | Temp 98.1°F | Ht 70.0 in | Wt 215.0 lb

## 2012-07-30 DIAGNOSIS — B20 Human immunodeficiency virus [HIV] disease: Secondary | ICD-10-CM

## 2012-07-30 DIAGNOSIS — F329 Major depressive disorder, single episode, unspecified: Secondary | ICD-10-CM

## 2012-07-30 DIAGNOSIS — E785 Hyperlipidemia, unspecified: Secondary | ICD-10-CM

## 2012-07-30 NOTE — Assessment & Plan Note (Signed)
He is doing very well. Will cont his current ART. He will be scheduled for colonoscopy via his PCP. He is offered/refuses condoms. Will see him back in 6 months with labs prior. vax up to date.

## 2012-07-30 NOTE — Assessment & Plan Note (Signed)
Doing well, weaning off effexor. Seems to be coping well with his mom's health issues.

## 2012-07-30 NOTE — Assessment & Plan Note (Signed)
Lab Results  Component Value Date   CHOL 125 07/22/2012   HDL 34* 07/22/2012   LDLCALC 66 07/22/2012   TRIG 126 07/22/2012   CHOLHDL 3.7 07/22/2012    Doing well, on no meds

## 2012-07-30 NOTE — Progress Notes (Signed)
  Subjective:    Patient ID: Darrell Estrada, male    DOB: [birth year], 52 y.o.   MRN: [id number]  HPI 52 yo M with hx of hyperlipidemia and AIDS dx 08-2008 in [place] [facility] (CD4 84). He thought he had "flu" at the time. It progressed and he was thought to have kidney stone. He had what sounds like bacteremia due to this and he had a HIV+ test then.  He was started on atripla.   HIV 1 RNA Quant (copies/mL)  Date Value  07/22/2012 51*  12/03/2011 <20   01/17/2011 <20      CD4 T Cell Abs (cmm)  Date Value  07/22/2012 420   12/03/2011 360*  01/17/2011 400     Has been busy with taking care of his his mom who got a staph infection in her back after surgery. Has been doing well effexor, taking qod now. Tapering off.   Has been exercising, lost some wt (225 --> 210). Running 4 miles tiw.   Review of Systems  Constitutional: Negative for fever, chills, appetite change and unexpected weight change.  Gastrointestinal: Negative for diarrhea and constipation.  Genitourinary: Negative for difficulty urinating.       Objective:   Physical Exam  Constitutional: He appears well-developed and well-nourished.  HENT:  Mouth/Throat: No oropharyngeal exudate.  Eyes: EOM are normal. Pupils are equal, round, and reactive to light.  Neck: Neck supple.  Cardiovascular: Normal rate, regular rhythm and normal heart sounds.   Pulmonary/Chest: Effort normal and breath sounds normal.  Abdominal: Soft. Bowel sounds are normal. There is no tenderness. There is no rebound.  Lymphadenopathy:    He has no cervical adenopathy.          Assessment & Plan:

## 2013-01-21 ENCOUNTER — Other Ambulatory Visit (INDEPENDENT_AMBULATORY_CARE_PROVIDER_SITE_OTHER): Payer: BC Managed Care – PPO

## 2013-01-21 ENCOUNTER — Ambulatory Visit: Payer: Self-pay

## 2013-01-21 DIAGNOSIS — B20 Human immunodeficiency virus [HIV] disease: Secondary | ICD-10-CM

## 2013-01-21 LAB — CBC
HCT: 43.9 % (ref 39.0–52.0)
Hemoglobin: 15 g/dL (ref 13.0–17.0)
MCH: 32.9 pg (ref 26.0–34.0)
MCV: 96.3 fL (ref 78.0–100.0)
Platelets: 201 10*3/uL (ref 150–400)
RBC: 4.56 MIL/uL (ref 4.22–5.81)
WBC: 5.2 10*3/uL (ref 4.0–10.5)

## 2013-01-21 LAB — COMPREHENSIVE METABOLIC PANEL
ALT: 37 U/L (ref 0–53)
BUN: 18 mg/dL (ref 6–23)
CO2: 28 mEq/L (ref 19–32)
Calcium: 9.6 mg/dL (ref 8.4–10.5)
Chloride: 104 mEq/L (ref 96–112)
Creat: 1.14 mg/dL (ref 0.50–1.35)
Glucose, Bld: 138 mg/dL — ABNORMAL HIGH (ref 70–99)
Total Bilirubin: 0.5 mg/dL (ref 0.3–1.2)

## 2013-01-22 LAB — HEPATITIS B SURFACE ANTIBODY,QUALITATIVE: Hep B S Ab: POSITIVE — AB

## 2013-01-22 LAB — T-HELPER CELL (CD4) - (RCID CLINIC ONLY)
CD4 % Helper T Cell: 31 % — ABNORMAL LOW (ref 33–55)
CD4 T Cell Abs: 470 /uL (ref 400–2700)

## 2013-02-04 ENCOUNTER — Encounter: Payer: Self-pay | Admitting: Infectious Diseases

## 2013-02-04 ENCOUNTER — Ambulatory Visit (INDEPENDENT_AMBULATORY_CARE_PROVIDER_SITE_OTHER): Payer: BC Managed Care – PPO | Admitting: Infectious Diseases

## 2013-02-04 VITALS — BP 129/84 | HR 76 | Temp 98.5°F | Ht 70.0 in | Wt 224.0 lb

## 2013-02-04 DIAGNOSIS — E785 Hyperlipidemia, unspecified: Secondary | ICD-10-CM

## 2013-02-04 DIAGNOSIS — Z79899 Other long term (current) drug therapy: Secondary | ICD-10-CM

## 2013-02-04 DIAGNOSIS — Z113 Encounter for screening for infections with a predominantly sexual mode of transmission: Secondary | ICD-10-CM

## 2013-02-04 DIAGNOSIS — Z23 Encounter for immunization: Secondary | ICD-10-CM

## 2013-02-04 DIAGNOSIS — B20 Human immunodeficiency virus [HIV] disease: Secondary | ICD-10-CM

## 2013-02-04 NOTE — Assessment & Plan Note (Signed)
Will mark as resolved. Has been normal off meds.

## 2013-02-04 NOTE — Assessment & Plan Note (Signed)
He's doing very well. Offered/refused condoms. Is exercising, wearing seat belt. Will colonoscopy via PCP. Has gotten flu shot. Will see him back in 6 weeks with labs prior.

## 2013-02-04 NOTE — Progress Notes (Signed)
  Subjective:    Patient ID: Darrell Estrada, male    DOB: February 19, 1961, 52 y.o.   MRN: [id number]  HPI 52 yo M with hx of hyperlipidemia and AIDS dx 08-2008 in  hospital (CD4 84).  Taking atripla, Mudlogger. Had tried to wean off efexor but "didn't feel right". Now taking qod.  PCP is scheduling colonoscopy.   HIV 1 RNA Quant (copies/mL)  Date Value  01/21/2013 <20   07/22/2012 51*  12/03/2011 <20      CD4 T Cell Abs (/uL)  Date Value  01/21/2013 470   07/22/2012 420   12/03/2011 360*      Review of Systems  Constitutional: Negative for appetite change and unexpected weight change.  Gastrointestinal: Negative for diarrhea and constipation.  Genitourinary: Negative for difficulty urinating.  wearing seat belt. Running 2-3x/week. 4 miles each.      Objective:   Physical Exam  Constitutional: He appears well-developed and well-nourished.  HENT:  Mouth/Throat: No oropharyngeal exudate.  Eyes: EOM are normal. Pupils are equal, round, and reactive to light.  Neck: Neck supple.  Cardiovascular: Normal rate, regular rhythm and normal heart sounds.   Pulmonary/Chest: Effort normal and breath sounds normal.  Abdominal: Soft. Bowel sounds are normal. He exhibits no distension. There is no tenderness.  Musculoskeletal: He exhibits no edema.  Lymphadenopathy:    He has no cervical adenopathy.          Assessment & Plan:

## 2013-03-23 ENCOUNTER — Other Ambulatory Visit: Payer: Self-pay | Admitting: Licensed Clinical Social Worker

## 2013-03-23 DIAGNOSIS — B20 Human immunodeficiency virus [HIV] disease: Secondary | ICD-10-CM

## 2013-03-23 MED ORDER — EFAVIRENZ-EMTRICITAB-TENOFOVIR 600-200-300 MG PO TABS: 1.0000 | ORAL_TABLET | Freq: Every day | ORAL | Status: DC

## 2013-04-20 ENCOUNTER — Observation Stay: Payer: Self-pay | Admitting: Internal Medicine

## 2013-04-20 LAB — CBC
HGB: 16.1 g/dL (ref 13.0–18.0)
MCH: 34.2 pg — ABNORMAL HIGH (ref 26.0–34.0)
MCV: 97 fL (ref 80–100)
Platelet: 215 10*3/uL (ref 150–440)
RBC: 4.7 10*6/uL (ref 4.40–5.90)
RDW: 12 % (ref 11.5–14.5)
WBC: 6.3 10*3/uL (ref 3.8–10.6)

## 2013-04-20 LAB — COMPREHENSIVE METABOLIC PANEL
Albumin: 4.1 g/dL (ref 3.4–5.0)
Alkaline Phosphatase: 175 U/L — ABNORMAL HIGH
Anion Gap: 4 — ABNORMAL LOW (ref 7–16)
Bilirubin,Total: 0.4 mg/dL (ref 0.2–1.0)
Calcium, Total: 8.9 mg/dL (ref 8.5–10.1)
Chloride: 109 mmol/L — ABNORMAL HIGH (ref 98–107)
Co2: 25 mmol/L (ref 21–32)
EGFR (African American): >60
EGFR (Non-African Amer.): >60
Glucose: 130 mg/dL — ABNORMAL HIGH (ref 65–99)
Potassium: 4 mmol/L (ref 3.5–5.1)
SGOT(AST): 31 U/L (ref 15–37)
SGPT (ALT): 48 U/L (ref 12–78)
Sodium: 138 mmol/L (ref 136–145)
Total Protein: 7.9 g/dL (ref 6.4–8.2)

## 2013-04-20 LAB — TROPONIN I: Troponin-I: <0.02 ng/mL

## 2013-04-20 LAB — CK TOTAL AND CKMB (NOT AT ARMC): CK, Total: 110 U/L (ref 35–232)

## 2013-04-21 DIAGNOSIS — R079 Chest pain, unspecified: Secondary | ICD-10-CM

## 2013-04-21 LAB — BASIC METABOLIC PANEL
Anion Gap: 3 — ABNORMAL LOW (ref 7–16)
BUN: 18 mg/dL (ref 7–18)
Calcium, Total: 8.8 mg/dL (ref 8.5–10.1)
Chloride: 105 mmol/L (ref 98–107)
Creatinine: 1.17 mg/dL (ref 0.60–1.30)
EGFR (Non-African Amer.): >60
Glucose: 116 mg/dL — ABNORMAL HIGH (ref 65–99)
Osmolality: 277 (ref 275–301)
Potassium: 3.8 mmol/L (ref 3.5–5.1)
Sodium: 137 mmol/L (ref 136–145)

## 2013-04-21 LAB — MAGNESIUM: Magnesium: 1.9 mg/dL

## 2013-04-21 LAB — CBC WITH DIFFERENTIAL/PLATELET
Basophil %: 0.5 %
Eosinophil #: 0.1 10*3/uL (ref 0.0–0.7)
HCT: 45.6 % (ref 40.0–52.0)
HGB: 15.8 g/dL (ref 13.0–18.0)
Lymphocyte #: 1.7 10*3/uL (ref 1.0–3.6)
Lymphocyte %: 27.3 %
MCHC: 34.6 g/dL (ref 32.0–36.0)
Neutrophil #: 3.5 10*3/uL (ref 1.4–6.5)
Neutrophil %: 58.4 %
WBC: 6.1 10*3/uL (ref 3.8–10.6)

## 2013-04-21 LAB — LIPID PANEL
Cholesterol: 152 mg/dL (ref 0–200)
Ldl Cholesterol, Calc: 65 mg/dL (ref 0–100)
Triglycerides: 245 mg/dL — ABNORMAL HIGH (ref 0–200)

## 2013-07-06 ENCOUNTER — Ambulatory Visit: Payer: BC Managed Care – PPO

## 2013-07-09 ENCOUNTER — Other Ambulatory Visit: Payer: Self-pay | Admitting: *Deleted

## 2013-07-09 DIAGNOSIS — B20 Human immunodeficiency virus [HIV] disease: Secondary | ICD-10-CM

## 2013-07-09 MED ORDER — EFAVIRENZ-EMTRICITAB-TENOFOVIR 600-200-300 MG PO TABS: 1.0000 | ORAL_TABLET | Freq: Every day | ORAL | Status: DC

## 2013-07-14 ENCOUNTER — Other Ambulatory Visit: Payer: Self-pay | Admitting: Infectious Diseases

## 2013-07-14 DIAGNOSIS — B20 Human immunodeficiency virus [HIV] disease: Secondary | ICD-10-CM

## 2013-07-14 MED ORDER — EFAVIRENZ-EMTRICITAB-TENOFOVIR 600-200-300 MG PO TABS: 1.0000 | ORAL_TABLET | Freq: Every day | ORAL | Status: DC

## 2013-08-05 ENCOUNTER — Other Ambulatory Visit: Payer: Self-pay | Admitting: *Deleted

## 2013-08-05 ENCOUNTER — Other Ambulatory Visit: Payer: No Typology Code available for payment source

## 2013-08-05 DIAGNOSIS — Z79899 Other long term (current) drug therapy: Secondary | ICD-10-CM

## 2013-08-05 DIAGNOSIS — B20 Human immunodeficiency virus [HIV] disease: Secondary | ICD-10-CM

## 2013-08-05 DIAGNOSIS — Z113 Encounter for screening for infections with a predominantly sexual mode of transmission: Secondary | ICD-10-CM

## 2013-08-05 LAB — CBC WITH DIFFERENTIAL/PLATELET
BASOS ABS: 0 10*3/uL (ref 0.0–0.1)
Basophils Relative: 0 % (ref 0–1)
EOS ABS: 0.1 10*3/uL (ref 0.0–0.7)
EOS PCT: 2 % (ref 0–5)
HEMATOCRIT: 44.5 % (ref 39.0–52.0)
Hemoglobin: 16 g/dL (ref 13.0–17.0)
LYMPHS ABS: 1.1 10*3/uL (ref 0.7–4.0)
LYMPHS PCT: 26 % (ref 12–46)
MCH: 34.3 pg — AB (ref 26.0–34.0)
MCHC: 36 g/dL (ref 30.0–36.0)
MCV: 95.5 fL (ref 78.0–100.0)
MONO ABS: 0.5 10*3/uL (ref 0.1–1.0)
Monocytes Relative: 11 % (ref 3–12)
Neutro Abs: 2.6 10*3/uL (ref 1.7–7.7)
Neutrophils Relative %: 61 % (ref 43–77)
Platelets: 193 10*3/uL (ref 150–400)
RBC: 4.66 MIL/uL (ref 4.22–5.81)
RDW: 13.3 % (ref 11.5–15.5)
WBC: 4.2 10*3/uL (ref 4.0–10.5)

## 2013-08-05 LAB — COMPLETE METABOLIC PANEL WITH GFR
ALBUMIN: 4.4 g/dL (ref 3.5–5.2)
ALT: 40 U/L (ref 0–53)
AST: 25 U/L (ref 0–37)
Alkaline Phosphatase: 119 U/L — ABNORMAL HIGH (ref 39–117)
BUN: 23 mg/dL (ref 6–23)
CO2: 26 meq/L (ref 19–32)
Calcium: 9.2 mg/dL (ref 8.4–10.5)
Chloride: 107 mEq/L (ref 96–112)
Creat: 1.15 mg/dL (ref 0.50–1.35)
GFR, EST AFRICAN AMERICAN: 85 mL/min
GFR, EST NON AFRICAN AMERICAN: 73 mL/min
GLUCOSE: 97 mg/dL (ref 70–99)
POTASSIUM: 4.7 meq/L (ref 3.5–5.3)
SODIUM: 140 meq/L (ref 135–145)
TOTAL PROTEIN: 7.1 g/dL (ref 6.0–8.3)
Total Bilirubin: 0.5 mg/dL (ref 0.2–1.2)

## 2013-08-05 LAB — LIPID PANEL
Cholesterol: 135 mg/dL (ref 0–200)
HDL: 38 mg/dL — ABNORMAL LOW (ref >39)
LDL Cholesterol: 77 mg/dL (ref 0–99)
Total CHOL/HDL Ratio: 3.6 Ratio
Triglycerides: 98 mg/dL (ref <150)
VLDL: 20 mg/dL (ref 0–40)

## 2013-08-05 LAB — RPR

## 2013-08-05 MED ORDER — EFAVIRENZ-EMTRICITAB-TENOFOVIR 600-200-300 MG PO TABS: 1.0000 | ORAL_TABLET | Freq: Every day | ORAL | Status: DC

## 2013-08-06 LAB — T-HELPER CELL (CD4) - (RCID CLINIC ONLY)
CD4 % Helper T Cell: 24 % — ABNORMAL LOW (ref 33–55)
CD4 T Cell Abs: 280 /uL — ABNORMAL LOW (ref 400–2700)

## 2013-08-07 LAB — HIV-1 RNA QUANT-NO REFLEX-BLD
HIV 1 RNA Quant: <20 copies/mL (ref <20)
HIV-1 RNA Quant, Log: <1.3 {Log} (ref <1.30)

## 2013-08-19 ENCOUNTER — Encounter: Payer: Self-pay | Admitting: Infectious Diseases

## 2013-08-19 ENCOUNTER — Ambulatory Visit (INDEPENDENT_AMBULATORY_CARE_PROVIDER_SITE_OTHER): Payer: No Typology Code available for payment source | Admitting: Infectious Diseases

## 2013-08-19 VITALS — BP 129/71 | HR 75 | Temp 98.9°F | Ht 70.0 in | Wt 209.0 lb

## 2013-08-19 DIAGNOSIS — B20 Human immunodeficiency virus [HIV] disease: Secondary | ICD-10-CM

## 2013-08-19 DIAGNOSIS — F3289 Other specified depressive episodes: Secondary | ICD-10-CM

## 2013-08-19 DIAGNOSIS — F329 Major depressive disorder, single episode, unspecified: Secondary | ICD-10-CM

## 2013-08-19 NOTE — Assessment & Plan Note (Signed)
He is doing well on effexor. Will continue.

## 2013-08-19 NOTE — Assessment & Plan Note (Signed)
He is doing well, he had a dip in his CD4. Will continue to monitor. He was previously Hep B S Ab+. I encouraged him to stick with his diet and exercise program for wt loss. He will have colonoscopy in May, asked him to send us results. Will see him back in 6 months.

## 2013-08-19 NOTE — Progress Notes (Signed)
   Subjective:    Patient ID: Darrell CollumHerbert L Donati, male    DOB: [birth year], 53 y.o.   MRN: [id number]  HPI 53 yo M with hx of hyperlipidemia and AIDS dx 08-2008 in [place] [facility] (CD4 84).  Taking atripla, Mudloggerefexor.  Doing well. No problems with his atripla or issues with anxiety.  Was seen in nov for? MI- was never pin pointed what he had (GERD vs anxiety). Had negative GXT.    HIV 1 RNA Quant (copies/mL)  Date Value  08/05/2013 <20   01/21/2013 <20   07/22/2012 51*     CD4 T Cell Abs (/uL)  Date Value  08/05/2013 280*  01/21/2013 470   07/22/2012 420     Review of Systems  Constitutional: Negative for activity change and unexpected weight change.  Gastrointestinal: Negative for diarrhea and constipation.  Genitourinary: Negative for difficulty urinating.  Psychiatric/Behavioral: The patient is not nervous/anxious.        Objective:   Physical Exam  Constitutional: He appears well-developed and well-nourished.  HENT:  Mouth/Throat: No oropharyngeal exudate.  Eyes: EOM are normal. Pupils are equal, round, and reactive to light.  Neck: Neck supple.  Cardiovascular: Normal rate, regular rhythm and normal heart sounds.   Pulmonary/Chest: Effort normal and breath sounds normal.  Abdominal: Soft. Bowel sounds are normal. He exhibits no distension. There is no tenderness.  Lymphadenopathy:    He has no cervical adenopathy.          Assessment & Plan:

## 2013-11-30 ENCOUNTER — Ambulatory Visit: Payer: No Typology Code available for payment source

## 2013-12-03 ENCOUNTER — Other Ambulatory Visit: Payer: Self-pay | Admitting: *Deleted

## 2013-12-03 DIAGNOSIS — B20 Human immunodeficiency virus [HIV] disease: Secondary | ICD-10-CM

## 2013-12-03 MED ORDER — EFAVIRENZ-EMTRICITAB-TENOFOVIR 600-200-300 MG PO TABS: 1.0000 | ORAL_TABLET | Freq: Every day | ORAL | Status: DC

## 2013-12-03 NOTE — Progress Notes (Signed)
ADAP application 

## 2013-12-18 ENCOUNTER — Ambulatory Visit: Payer: Self-pay | Admitting: Gastroenterology

## 2013-12-21 LAB — PATHOLOGY REPORT

## 2014-02-10 ENCOUNTER — Other Ambulatory Visit: Payer: No Typology Code available for payment source

## 2014-02-10 DIAGNOSIS — B20 Human immunodeficiency virus [HIV] disease: Secondary | ICD-10-CM

## 2014-02-10 DIAGNOSIS — Z113 Encounter for screening for infections with a predominantly sexual mode of transmission: Secondary | ICD-10-CM

## 2014-02-11 LAB — HIV-1 RNA QUANT-NO REFLEX-BLD: HIV 1 RNA Quant: <20 copies/mL (ref <20)

## 2014-02-11 LAB — T-HELPER CELL (CD4) - (RCID CLINIC ONLY)
CD4 % Helper T Cell: 30 % — ABNORMAL LOW (ref 33–55)
CD4 T CELL ABS: 420 /uL (ref 400–2700)

## 2014-02-24 ENCOUNTER — Encounter: Payer: Self-pay | Admitting: Infectious Diseases

## 2014-02-24 ENCOUNTER — Ambulatory Visit (INDEPENDENT_AMBULATORY_CARE_PROVIDER_SITE_OTHER): Payer: No Typology Code available for payment source | Admitting: Infectious Diseases

## 2014-02-24 VITALS — BP 128/83 | HR 85 | Temp 98.1°F | Ht 70.0 in | Wt 217.0 lb

## 2014-02-24 DIAGNOSIS — Z23 Encounter for immunization: Secondary | ICD-10-CM

## 2014-02-24 DIAGNOSIS — B20 Human immunodeficiency virus [HIV] disease: Secondary | ICD-10-CM

## 2014-02-24 DIAGNOSIS — Z113 Encounter for screening for infections with a predominantly sexual mode of transmission: Secondary | ICD-10-CM

## 2014-02-24 DIAGNOSIS — Z79899 Other long term (current) drug therapy: Secondary | ICD-10-CM

## 2014-02-24 DIAGNOSIS — F3289 Other specified depressive episodes: Secondary | ICD-10-CM

## 2014-02-24 DIAGNOSIS — F329 Major depressive disorder, single episode, unspecified: Secondary | ICD-10-CM

## 2014-02-24 NOTE — Assessment & Plan Note (Signed)
He is doing very well on his current dose of effexor. Will continue.

## 2014-02-24 NOTE — Assessment & Plan Note (Signed)
He is doing very well. Gets flu shot today. Offered/refused condoms. Will need PNVX booster next year. His Hep B S Ab is +.

## 2014-02-24 NOTE — Progress Notes (Signed)
   Subjective:    Patient ID: Darrell Estrada, male    DOB: January 02, 1961, 53 y.o.   MRN: [id number]  HPI 53 yo M with hx of hyperlipidemia and AIDS dx 08-2008 in [facility] (CD4 84).  Taking atripla, effexor.  Had colonoscopy in June 2015- few polyps, all benign. No problems with ART. No issues with mood.   HIV 1 RNA Quant (copies/mL)  Date Value  02/10/2014 <20   08/05/2013 <20   01/21/2013 <20      CD4 T Cell Abs (/uL)  Date Value  02/10/2014 420   08/05/2013 280*  01/21/2013 470       Review of Systems  Constitutional: Negative for appetite change and unexpected weight change.  Gastrointestinal: Negative for diarrhea and constipation.  Genitourinary: Negative for difficulty urinating.  Psychiatric/Behavioral: Negative for sleep disturbance and dysphoric mood.       Objective:   Physical Exam  Constitutional: He appears well-developed and well-nourished.  HENT:  Mouth/Throat: No oropharyngeal exudate.  Eyes: EOM are normal. Pupils are equal, round, and reactive to light.  Neck: Neck supple.  Cardiovascular: Normal rate, regular rhythm and normal heart sounds.   Pulmonary/Chest: Effort normal and breath sounds normal.  Abdominal: Soft. Bowel sounds are normal. He exhibits no distension. There is no tenderness.  Lymphadenopathy:    He has no cervical adenopathy.  Psychiatric: He has a normal mood and affect.          Assessment & Plan:

## 2014-03-26 ENCOUNTER — Other Ambulatory Visit: Payer: Self-pay | Admitting: Infectious Diseases

## 2014-06-21 ENCOUNTER — Ambulatory Visit: Payer: No Typology Code available for payment source

## 2014-07-16 ENCOUNTER — Other Ambulatory Visit: Payer: Self-pay | Admitting: *Deleted

## 2014-07-16 MED ORDER — EFAVIRENZ-EMTRICITAB-TENOFOVIR 600-200-300 MG PO TABS: 1.0000 | ORAL_TABLET | Freq: Every day | ORAL | Status: DC

## 2014-08-11 ENCOUNTER — Other Ambulatory Visit: Payer: 59

## 2014-08-11 DIAGNOSIS — Z113 Encounter for screening for infections with a predominantly sexual mode of transmission: Secondary | ICD-10-CM

## 2014-08-11 DIAGNOSIS — Z79899 Other long term (current) drug therapy: Secondary | ICD-10-CM

## 2014-08-11 DIAGNOSIS — B20 Human immunodeficiency virus [HIV] disease: Secondary | ICD-10-CM

## 2014-08-11 LAB — COMPREHENSIVE METABOLIC PANEL
ALK PHOS: 144 U/L — AB (ref 39–117)
ALT: 34 U/L (ref 0–53)
AST: 21 U/L (ref 0–37)
Albumin: 4.4 g/dL (ref 3.5–5.2)
BUN: 22 mg/dL (ref 6–23)
CO2: 23 mEq/L (ref 19–32)
Calcium: 9.2 mg/dL (ref 8.4–10.5)
Chloride: 104 mEq/L (ref 96–112)
Creat: 1.17 mg/dL (ref 0.50–1.35)
Glucose, Bld: 87 mg/dL (ref 70–99)
POTASSIUM: 5.1 meq/L (ref 3.5–5.3)
Sodium: 140 mEq/L (ref 135–145)
Total Bilirubin: 0.4 mg/dL (ref 0.2–1.2)
Total Protein: 7.1 g/dL (ref 6.0–8.3)

## 2014-08-11 LAB — CBC
HEMATOCRIT: 46.6 % (ref 39.0–52.0)
Hemoglobin: 16.1 g/dL (ref 13.0–17.0)
MCH: 33.8 pg (ref 26.0–34.0)
MCHC: 34.5 g/dL (ref 30.0–36.0)
MCV: 97.7 fL (ref 78.0–100.0)
MPV: 10.3 fL (ref 8.6–12.4)
PLATELETS: 181 10*3/uL (ref 150–400)
RBC: 4.77 MIL/uL (ref 4.22–5.81)
RDW: 13.1 % (ref 11.5–15.5)
WBC: 4.2 10*3/uL (ref 4.0–10.5)

## 2014-08-11 LAB — LIPID PANEL
Cholesterol: 127 mg/dL (ref 0–200)
HDL: 41 mg/dL (ref >=40)
LDL CALC: 64 mg/dL (ref 0–99)
Total CHOL/HDL Ratio: 3.1 Ratio
Triglycerides: 110 mg/dL (ref <150)
VLDL: 22 mg/dL (ref 0–40)

## 2014-08-12 LAB — T-HELPER CELL (CD4) - (RCID CLINIC ONLY)
CD4 % Helper T Cell: 28 % — ABNORMAL LOW (ref 33–55)
CD4 T Cell Abs: 330 /uL — ABNORMAL LOW (ref 400–2700)

## 2014-08-12 LAB — URINE CYTOLOGY ANCILLARY ONLY
Chlamydia: NEGATIVE
Neisseria Gonorrhea: NEGATIVE

## 2014-08-12 LAB — RPR

## 2014-08-12 LAB — HIV-1 RNA QUANT-NO REFLEX-BLD

## 2014-08-16 ENCOUNTER — Other Ambulatory Visit: Payer: Self-pay | Admitting: Infectious Diseases

## 2014-08-25 ENCOUNTER — Ambulatory Visit: Payer: No Typology Code available for payment source | Admitting: Infectious Diseases

## 2014-08-30 ENCOUNTER — Ambulatory Visit: Payer: No Typology Code available for payment source | Admitting: Infectious Diseases

## 2014-09-07 ENCOUNTER — Encounter: Payer: Self-pay | Admitting: Infectious Diseases

## 2014-09-07 ENCOUNTER — Ambulatory Visit (INDEPENDENT_AMBULATORY_CARE_PROVIDER_SITE_OTHER): Payer: 59 | Admitting: Infectious Diseases

## 2014-09-07 VITALS — BP 145/73 | HR 78 | Temp 98.2°F | Ht 70.0 in | Wt 200.0 lb

## 2014-09-07 DIAGNOSIS — F329 Major depressive disorder, single episode, unspecified: Secondary | ICD-10-CM | POA: Diagnosis not present

## 2014-09-07 DIAGNOSIS — B20 Human immunodeficiency virus [HIV] disease: Secondary | ICD-10-CM | POA: Diagnosis not present

## 2014-09-07 DIAGNOSIS — Z23 Encounter for immunization: Secondary | ICD-10-CM

## 2014-09-07 DIAGNOSIS — F32A Depression, unspecified: Secondary | ICD-10-CM

## 2014-09-07 NOTE — Addendum Note (Signed)
Addended by: Andree CossHOWELL, MICHELLE M on: 09/07/2014 02:43 PM   Modules accepted: Orders

## 2014-09-07 NOTE — Assessment & Plan Note (Signed)
He is doing well. Will give him pnvx booster. He has gotten hep a. He is hep B immune.  Offered/refused condoms.  Will see him back in 6 months.

## 2014-09-07 NOTE — Progress Notes (Signed)
   Subjective:    Patient ID: Darrell CollumHerbert L Estrada, male    DOB: Nov 28, 1960, 54 y.o.   MRN: [id number]  HPI  54 yo M with hx of hyperlipidemia and AIDS dx 08-2008 [facility] (CD4 84).  Taking atripla, effexor.  Had colonoscopy in June 2015- few polyps, all benign.  HIV 1 RNA QUANT (copies/mL)  Date Value  08/11/2014 <20  02/10/2014 <20  08/05/2013 <20   CD4 T CELL ABS (/uL)  Date Value  08/11/2014 330*  02/10/2014 420  08/05/2013 280*   Has been busy caring for his dad with terminal stomach CA. His mood has been good, still taking effexor.  No problems with atripla- no abn dreams.   Has lost ~#15. Intentionally. Watching what he is eating, exercising more.   Review of Systems  Constitutional: Negative for appetite change and unexpected weight change.  Gastrointestinal: Negative for diarrhea and constipation.  Genitourinary: Negative for difficulty urinating.       Objective:   Physical Exam  Constitutional: He appears well-developed and well-nourished.  HENT:  Mouth/Throat: No oropharyngeal exudate.  Eyes: EOM are normal. Pupils are equal, round, and reactive to light.  Neck: Neck supple.  Cardiovascular: Normal rate, regular rhythm and normal heart sounds.   Pulmonary/Chest: Effort normal and breath sounds normal.  Abdominal: Soft. Bowel sounds are normal. He exhibits no distension. There is no tenderness.  Lymphadenopathy:    He has no cervical adenopathy.      Assessment & Plan:

## 2014-09-07 NOTE — Assessment & Plan Note (Signed)
Doing well on effexor.  Will continue, f/u prn.

## 2014-09-17 NOTE — H&P (Signed)
PATIENT NAME:  Darrell Estrada, Darrell Estrada MR#:  [id number] DATE OF BIRTH:  04/29/1961  DATE OF ADMISSION:  04/20/2013  REASON FOR ADMISSION: Chest pain.   PRIMARY CARE PHYSICIAN: Dr. Judithann Sheen.  REFERRING PHYSICIAN: Dr. Lenard Lance  HISTORY OF PRESENT ILLNESS: This is a very nice 54 year old gentleman who has a history of HIV, depression and kidney stones. He comes with a history of not feeling very well this morning. He did not feel "quite right". He states that he went to work in the morning, at 8:45, felt some sensation of being sluggish, confused and slightly disoriented and then started having chest pain at the moment. The patient states that when the pain started it was just mild, 1 or 2 out of 10, more like a pressure-like and then started worsening as he started to walk and to sit into a chair. The pain was described as pressure-like, somebody putting a foot on top of his chest and standing on it, crushing, with radiation to the left arm. The intensity of the pain was 9 to 10 out of 10. The patient started feeling cold and clammy, very diaphoretic at the moment. The pain lasted until he came to the ER and received his first dose of nitroglycerin, which happened at around 10:30. So,  lasted for at least hour and a half. The patient states that after that the pain has decreased in intensity, right now is 2 out of 10. The patient has been very active all of his life up until the last couple of months. He has not been that active just with changes in his schedule. He is not going to the gym, but whenever he goes to the gym he does cardio and he walks a lot. His work keeps him very occupied and he is very active at work. 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 He never had chest pain during work. He says that he has been having occasional cough since in the past six months and it happens because of after his sinus surgery he has drainage into his back so occasionally  has some mucus in the morning. He has never had chest pain like this before, but he says that 2 days ago he had some mild chest pressure that went away within minutes. Other than that, the patient has been in his normal state of health. His EKG did not have any significant changes. Cardiac enzymes at first are negative. We are going to admit him to rule out acute coronary syndrome.   REVIEW OF SYSTEMS: 12-system review of systems is done. CONSTITUTIONAL: No fever, fatigue, weight loss or weight gain.   EYES: No blurry vision, double vision right now, but the patient states that during the episode of chest pain his vision was little bit blurry and sluggish.  EARS, NOSE, THROAT: No difficulty swallowing. No epistaxis. No significant snoring. RESPIRATORY: Morning cough as mentioned above with occasional clear phlegm. No wheezing. No hemoptysis. No COPD, no painful respirations. He has never smoked.  CARDIOVASCULAR: Chest pain as mentioned above. No orthopnea, edema or previous arrhythmias. No palpitations.  GASTROINTESTINAL: No nausea, vomiting, abdominal pain, constipation, diarrhea.  GENITOURINARY: No dysuria, hematuria, changes in frequency.  No prostatitis.  ENDOCRINE: No polyuria, polydipsia, polyphagia, cold or heat intolerance.  HEMATOLOGIC AND LYMPHATIC: No anemia, easy bruising or bleeding. No swollen glands. The patient has HIV. His last CD4 count is around 600. He has never been below 500.  SKIN: No rashes, petechiae or new lesions. No Kaposi lesions.  MUSCULOSKELETAL: No significant neck pain, back pain, swollen joints or gout.  NEUROLOGIC: No numbness, tingling. No CVAs or TIAs.  PSYCHIATRIC: No significant (agitation  or depression.   PAST MEDICAL HISTORY: 1. History of multiple kidney stones in the past.  2. Depression.  3. HIV/CD4 count 600 the last time.   ALLERGIES: CODEINE. THE PATIENT FELT THAT IT WAS TOO STRONG FOR HIM. HE CANNOT DESCRIBE THE REACTION.   MEDICATIONS: Atripla  once a day and Effexor 75 mg twice daily.   PAST SURGICAL HISTORY: 1. Appendectomy.  2. Lithotripsy.  3. Sinus surgery.  4. Open reduction and internal fixation on right upper extremity after a fracture as a kid.   FAMILY HISTORY: Positive for coronary artery disease in a grandfather, who had a heart attack, and several uncles. No cancer. No diabetes in the family.   SOCIAL HISTORY: The patient does not smoke, he never has. He drinks very rarely. 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   PHYSICAL EXAMINATION:  VITAL SIGNS: Blood pressure 130/77. On admission blood pressure was 159/90, pulse 72, respirations 20, temperature 98.3, oxygen saturation 99% now on room air.  GENERAL: Alert, oriented x 3, in no acute distress. No respiratory distress. Hemodynamically stable.  HEENT: Pupils are equal and reactive. Extraocular movements are intact. Mucosae are moist. Anicteric sclerae. Pink conjunctivae. No oral lesions. No oropharyngeal exudates.  NECK: Supple. No JVD. No thyromegaly. No adenopathy. No carotid bruits. No masses. No rigidity.  CARDIOVASCULAR: Regular rate and rhythm. No murmurs, rubs or gallops. There is significant reproduction of the pressure-like whenever I put my hand on top of the chest and put a little bit of pressure. The patient feels very uncomfortable, whenever I do that. No displacement of PMI.  LUNGS: Clear without any wheezing or crepitus. No use of accessory muscles.  ABDOMEN: Soft, nontender, nondistended. No hepatosplenomegaly. No masses. Bowel sounds are positive.  GENITAL: Deferred.  EXTREMITIES: No edema, cyanosis or clubbing.  VASCULAR: Pulses +2. Capillary refill less than 3.  SKIN: No rashes or petechiae or new lesions.  LYMPHATIC: Negative for lymphadenopathy in the neck, supraclavicular areas.  NEUROLOGIC: Cranial nerves II through XII intact. Strength is 5/5 in four extremities.  PSYCHIATRIC: Mood is normal without any  signs of depression or agitation.   LABORATORY, DIAGNOSTIC AND RADIOLOGICAL DATA: Glucose 130, BUN 19, creatinine 1.21, sodium 138, potassium 4, alkaline phosphatase 175, AST and ALT within normal limits. Troponin is 0.02. Total CK is 110. White count is 6.3, hemoglobin 16, platelet count 215.   EKG: No ST depression or elevation. Normal sinus rhythm  possible inferior infarct in the past, but that might be an ove- reading, I do not think that is any positive findings.   Chest x-ray. There is mild infiltrate over the right lung base described by the radiologist. To me it does not look necessarily like a pneumonia, more atelectasis. Repeat chest x-ray AP and lateral tomorrow morning. The patient does not have any tenderness on the right side of his chest.   ASSESSMENT AND PLAN: A 54 year old gentleman with a history of HIV, depression comes with significant chest pain.  1. Chest pain. The patient actually has a really good story with very good symptoms on description, pressure-like, crushing, something sitting on his chest, cold and clammy with diaphoresis and relieved by nitroglycerin for what we are going to admit him and monitor closely.  We are going to put him on telemetry, to exclude any arrhythmias. We are going to monitor cardiac enzymes every four hours to evaluate for possibility of acute coronary syndrome. The patient may also have a fixed defect for which if his troponins are negative and his non- acute coronary syndrome, we are going to get a stress test. The patient is going to be treated with beta blocker, aspirin, statin, morphine and p.r.n. oxygen. If there is any significant positive troponins, we are going to proceed to full anticoagulation with a heparin drip, but at this moment, we are going to hold on until positive results as there is no changes on his EKG. Other than that, the patient will be monitored closely. We are going to work on as secondary risk stratification, checking his  hemoglobin A1c and lipid profile. His glucose is 130 and he does not have history of diabetes just slightly elevated blood sugar.  2. As far as his HIV seems to be controlled seems to be doing well on Atripla. His last CD4 count is 600.  3. As far as his abnormal chest x-ray, there is a possibility of new infiltrate on right lower lobe. The patient does not have any significant symptoms of pneumonia. No fever. No tenderness over the chest on the right side. No pleuritic chest on the right side. We are going to observe and repeat the chest x-ray with AP and lateral view in the morning. No antibiotics indicated at this moment.  4. Deep vein thrombosis prophylaxis with Lovenox prophylactic dose again and GI prophylaxis with ranitidine as the proton pump inhibitor could affect the absorption of Atripla.   TIME SPENT: I spent about 45 minutes with this patient.   ____________________________ Felipa Furnaceoberto Sanchez Gutierrez, MD rsg:sg D: 04/20/2013 12:47:00 ET T: 04/20/2013 13:14:58 ET JOB#: [id number]  cc: Duane LopeJeffrey D. Judithann SheenSparks, MD Felipa Furnaceoberto Sanchez Gutierrez, MD, <Dictator>   Joshwa Hemric Juanda ChanceSANCHEZ GUTIERRE MD ELECTRONICALLY SIGNED 04/21/2013 13:38

## 2014-10-20 ENCOUNTER — Other Ambulatory Visit: Payer: Self-pay | Admitting: Internal Medicine

## 2014-10-20 DIAGNOSIS — R1310 Dysphagia, unspecified: Secondary | ICD-10-CM

## 2014-10-27 ENCOUNTER — Ambulatory Visit: Admission: RE | Admit: 2014-10-27 | Payer: 59 | Source: Ambulatory Visit

## 2014-11-10 ENCOUNTER — Other Ambulatory Visit: Payer: Self-pay | Admitting: Infectious Diseases

## 2015-06-21 ENCOUNTER — Other Ambulatory Visit: Payer: Self-pay | Admitting: *Deleted

## 2015-06-21 DIAGNOSIS — B20 Human immunodeficiency virus [HIV] disease: Secondary | ICD-10-CM

## 2015-06-21 MED ORDER — EFAVIRENZ-EMTRICITAB-TENOFOVIR 600-200-300 MG PO TABS: 1.0000 | ORAL_TABLET | Freq: Every day | ORAL | Status: DC

## 2015-06-28 ENCOUNTER — Other Ambulatory Visit: Payer: BLUE CROSS/BLUE SHIELD

## 2015-06-28 DIAGNOSIS — B20 Human immunodeficiency virus [HIV] disease: Secondary | ICD-10-CM

## 2015-06-29 LAB — T-HELPER CELL (CD4) - (RCID CLINIC ONLY)
CD4 % Helper T Cell: 27 % — ABNORMAL LOW (ref 33–55)
CD4 T CELL ABS: 410 /uL (ref 400–2700)

## 2015-06-29 LAB — HIV-1 RNA QUANT-NO REFLEX-BLD
HIV 1 RNA Quant: <20 copies/mL (ref <20)
HIV-1 RNA Quant, Log: <1.3 Log copies/mL (ref <1.30)

## 2015-07-12 ENCOUNTER — Encounter: Payer: Self-pay | Admitting: Infectious Diseases

## 2015-07-12 ENCOUNTER — Ambulatory Visit (INDEPENDENT_AMBULATORY_CARE_PROVIDER_SITE_OTHER): Payer: BLUE CROSS/BLUE SHIELD | Admitting: Infectious Diseases

## 2015-07-12 VITALS — BP 134/75 | HR 72 | Temp 98.4°F | Wt 211.0 lb

## 2015-07-12 DIAGNOSIS — Z113 Encounter for screening for infections with a predominantly sexual mode of transmission: Secondary | ICD-10-CM | POA: Diagnosis not present

## 2015-07-12 DIAGNOSIS — Z79899 Other long term (current) drug therapy: Secondary | ICD-10-CM

## 2015-07-12 DIAGNOSIS — B20 Human immunodeficiency virus [HIV] disease: Secondary | ICD-10-CM

## 2015-07-12 MED ORDER — EMTRICITAB-RILPIVIR-TENOFOV AF 200-25-25 MG PO TABS: 1.0000 | ORAL_TABLET | Freq: Every day | ORAL | Status: DC

## 2015-07-12 NOTE — Addendum Note (Signed)
Addended by: Lavada Langsam C on: 07/12/2015 04:41 PM   Modules accepted: Orders

## 2015-07-12 NOTE — Assessment & Plan Note (Signed)
He is doing very well Offered/refused condoms.  Will change him to odefsy Will see him back in 6 months He will meet with new ADAP coordinator today, his prev application has been lost.

## 2015-07-12 NOTE — Progress Notes (Signed)
   Subjective:    Patient ID: Darrell Estrada, male    DOB: 05-Dec-1960, 55 y.o.   MRN: [id number]  HPI 55 yo M with hx of hyperlipidemia and AIDS dx 08-2008 [facility] (CD4 84).  Taking atripla, effexor.  Had colonoscopy in June 2015- few polyps, all benign.  HIV 1 RNA QUANT (copies/mL)  Date Value  06/28/2015 <20  08/11/2014 <20  02/10/2014 <20   CD4 T CELL ABS (/uL)  Date Value  06/28/2015 410  08/11/2014 330*  02/10/2014 420   +  Has been busy trying to settle his father's estate.  Has gained 11#.  Has been exercising. Walked 3 miles today. Wearing seat belt.  Got flu shot in October.  Getting married this year.   Review of Systems  Constitutional: Negative for appetite change and unexpected weight change.  Gastrointestinal: Negative for diarrhea and constipation.  Genitourinary: Negative for difficulty urinating.  Please see HPI. 12 point ROS o/w (-)      Objective:   Physical Exam  Constitutional: He appears well-developed and well-nourished.  HENT:  Mouth/Throat: No oropharyngeal exudate.  Eyes: EOM are normal. Pupils are equal, round, and reactive to light.  Neck: Neck supple.  Cardiovascular: Normal rate, regular rhythm and normal heart sounds.   Pulmonary/Chest: Effort normal and breath sounds normal.  Abdominal: Soft. Bowel sounds are normal. There is no tenderness. There is no rebound.  Musculoskeletal: He exhibits edema.  Lymphadenopathy:    He has no cervical adenopathy.      Assessment & Plan:

## 2015-10-04 ENCOUNTER — Encounter: Payer: Self-pay | Admitting: Infectious Diseases

## 2016-01-12 ENCOUNTER — Ambulatory Visit: Payer: BLUE CROSS/BLUE SHIELD

## 2016-02-02 ENCOUNTER — Encounter: Payer: Self-pay | Admitting: Infectious Diseases

## 2016-02-13 ENCOUNTER — Other Ambulatory Visit: Payer: Self-pay | Admitting: Internal Medicine

## 2016-02-15 ENCOUNTER — Other Ambulatory Visit: Payer: Self-pay | Admitting: Internal Medicine

## 2016-02-15 DIAGNOSIS — R1310 Dysphagia, unspecified: Secondary | ICD-10-CM

## 2016-02-29 ENCOUNTER — Other Ambulatory Visit (HOSPITAL_COMMUNITY)
Admission: RE | Admit: 2016-02-29 | Discharge: 2016-02-29 | Disposition: A | Discharge status: Home / Self Care | Payer: BLUE CROSS/BLUE SHIELD | Source: Ambulatory Visit | Attending: Infectious Diseases | Admitting: Infectious Diseases

## 2016-02-29 ENCOUNTER — Other Ambulatory Visit: Payer: BLUE CROSS/BLUE SHIELD

## 2016-02-29 DIAGNOSIS — B20 Human immunodeficiency virus [HIV] disease: Secondary | ICD-10-CM

## 2016-02-29 DIAGNOSIS — Z79899 Other long term (current) drug therapy: Secondary | ICD-10-CM

## 2016-02-29 DIAGNOSIS — Z113 Encounter for screening for infections with a predominantly sexual mode of transmission: Secondary | ICD-10-CM | POA: Insufficient documentation

## 2016-02-29 LAB — COMPREHENSIVE METABOLIC PANEL
ALBUMIN: 4 g/dL (ref 3.6–5.1)
ALT: 25 U/L (ref 9–46)
AST: 23 U/L (ref 10–35)
Alkaline Phosphatase: 79 U/L (ref 40–115)
BUN: 25 mg/dL (ref 7–25)
CALCIUM: 9.4 mg/dL (ref 8.6–10.3)
CHLORIDE: 106 mmol/L (ref 98–110)
CO2: 24 mmol/L (ref 20–31)
Creat: 1.12 mg/dL (ref 0.70–1.33)
Glucose, Bld: 90 mg/dL (ref 65–99)
POTASSIUM: 4.3 mmol/L (ref 3.5–5.3)
Sodium: 140 mmol/L (ref 135–146)
TOTAL PROTEIN: 6.8 g/dL (ref 6.1–8.1)
Total Bilirubin: 0.7 mg/dL (ref 0.2–1.2)

## 2016-02-29 LAB — CBC
HEMATOCRIT: 42.7 % (ref 38.5–50.0)
HEMOGLOBIN: 14.7 g/dL (ref 13.2–17.1)
MCH: 33.8 pg — AB (ref 27.0–33.0)
MCHC: 34.4 g/dL (ref 32.0–36.0)
MCV: 98.2 fL (ref 80.0–100.0)
MPV: 10.4 fL (ref 7.5–12.5)
Platelets: 195 10*3/uL (ref 140–400)
RBC: 4.35 MIL/uL (ref 4.20–5.80)
RDW: 12.5 % (ref 11.0–15.0)
WBC: 4 10*3/uL (ref 3.8–10.8)

## 2016-02-29 LAB — LIPID PANEL
CHOL/HDL RATIO: 3.5 ratio (ref <=5.0)
CHOLESTEROL: 118 mg/dL — AB (ref 125–200)
HDL: 34 mg/dL — ABNORMAL LOW (ref >=40)
LDL Cholesterol: 50 mg/dL (ref <130)
TRIGLYCERIDES: 171 mg/dL — AB (ref <150)
VLDL: 34 mg/dL — AB (ref <30)

## 2016-03-01 LAB — URINE CYTOLOGY ANCILLARY ONLY
CHLAMYDIA, DNA PROBE: NEGATIVE
Neisseria Gonorrhea: NEGATIVE

## 2016-03-01 LAB — HIV-1 RNA QUANT-NO REFLEX-BLD

## 2016-03-01 LAB — T-HELPER CELL (CD4) - (RCID CLINIC ONLY)
CD4 T CELL ABS: 310 /uL — AB (ref 400–2700)
CD4 T CELL HELPER: 30 % — AB (ref 33–55)

## 2016-03-01 LAB — RPR

## 2016-03-05 ENCOUNTER — Ambulatory Visit
Admission: RE | Admit: 2016-03-05 | Discharge: 2016-03-05 | Disposition: A | Discharge status: Home / Self Care | Payer: BLUE CROSS/BLUE SHIELD | Source: Ambulatory Visit | Attending: Internal Medicine | Admitting: Internal Medicine

## 2016-03-05 DIAGNOSIS — R1310 Dysphagia, unspecified: Secondary | ICD-10-CM | POA: Diagnosis not present

## 2016-03-14 ENCOUNTER — Encounter: Payer: Self-pay | Admitting: Infectious Diseases

## 2016-03-14 ENCOUNTER — Ambulatory Visit (INDEPENDENT_AMBULATORY_CARE_PROVIDER_SITE_OTHER): Payer: BLUE CROSS/BLUE SHIELD | Admitting: Infectious Diseases

## 2016-03-14 VITALS — BP 132/85 | HR 66 | Temp 98.4°F | Ht 70.0 in | Wt 219.0 lb

## 2016-03-14 DIAGNOSIS — Z79899 Other long term (current) drug therapy: Secondary | ICD-10-CM

## 2016-03-14 DIAGNOSIS — N200 Calculus of kidney: Secondary | ICD-10-CM

## 2016-03-14 DIAGNOSIS — E785 Hyperlipidemia, unspecified: Secondary | ICD-10-CM

## 2016-03-14 DIAGNOSIS — Z113 Encounter for screening for infections with a predominantly sexual mode of transmission: Secondary | ICD-10-CM | POA: Diagnosis not present

## 2016-03-14 DIAGNOSIS — B20 Human immunodeficiency virus [HIV] disease: Secondary | ICD-10-CM | POA: Diagnosis not present

## 2016-03-14 DIAGNOSIS — Z23 Encounter for immunization: Secondary | ICD-10-CM | POA: Diagnosis not present

## 2016-03-14 DIAGNOSIS — F341 Dysthymic disorder: Secondary | ICD-10-CM | POA: Diagnosis not present

## 2016-03-14 NOTE — Assessment & Plan Note (Signed)
He is doing well Will see him back in 6 months He is wearing his seat belt.  He will work on diet and exercise.  Has repeat colon in 4 years Offered/refused condoms.

## 2016-03-14 NOTE — Progress Notes (Signed)
   Subjective:    Patient ID: Darrell Estrada, male    DOB: [birth year], 55 y.o.   MRN: [id number]  HPI 55 yo M with hx of hyperlipidemia, nephrolithiasis, and AIDS dx 08-2008 [facility] (CD4 84).  Taking atripla until 06-2015 then changed to odesy, effexor.  Had colonoscopy in June 2015- few polyps, all benign.  HIV 1 RNA Quant (copies/mL)  Date Value  02/29/2016 <20  06/28/2015 <20  08/11/2014 <20   CD4 T Cell Abs (/uL)  Date Value  02/29/2016 310 (L)  06/28/2015 410  08/11/2014 330 (L)   Lab Results  Component Value Date   CHOL 118 (L) 02/29/2016   HDL 34 (L) 02/29/2016   LDLCALC 50 02/29/2016   TRIG 171 (H) 02/29/2016   CHOLHDL 3.5 02/29/2016      Feeling well. Mood has been great.  Gets flu shot today.  Was dx with UTI 2 weeks ago, was told it was from kidney stone. Was asx, dx on routine physical. Had similar episode prior with kidney stone.   Review of Systems  Constitutional: Negative for appetite change and unexpected weight change.  Gastrointestinal: Negative for constipation and diarrhea.  Genitourinary: Negative for difficulty urinating.       Objective:   Physical Exam  Constitutional: He appears well-developed and well-nourished.  HENT:  Mouth/Throat: No oropharyngeal exudate.  Eyes: EOM are normal. Pupils are equal, round, and reactive to light.  Neck: Neck supple.  Cardiovascular: Normal rate, regular rhythm and normal heart sounds.   Pulmonary/Chest: Effort normal and breath sounds normal.  Abdominal: Soft. Bowel sounds are normal. There is no tenderness. There is no rebound.  Musculoskeletal: He exhibits no edema.  Lymphadenopathy:    He has no cervical adenopathy.       Assessment & Plan:

## 2016-03-14 NOTE — Assessment & Plan Note (Signed)
Doing well on his rx.  F/u with PCP

## 2016-03-14 NOTE — Assessment & Plan Note (Signed)
Will f/u with PCP and Uro

## 2016-03-14 NOTE — Assessment & Plan Note (Signed)
Lab Results  Component Value Date   CHOL 118 (L) 02/29/2016   HDL 34 (L) 02/29/2016   LDLCALC 50 02/29/2016   TRIG 171 (H) 02/29/2016   CHOLHDL 3.5 02/29/2016     doing well  Will watch diet and resume exercise

## 2016-07-04 ENCOUNTER — Other Ambulatory Visit: Payer: Self-pay | Admitting: Infectious Diseases

## 2016-07-04 DIAGNOSIS — B20 Human immunodeficiency virus [HIV] disease: Secondary | ICD-10-CM

## 2016-07-09 ENCOUNTER — Ambulatory Visit: Payer: BLUE CROSS/BLUE SHIELD

## 2016-07-12 ENCOUNTER — Encounter: Payer: Self-pay | Admitting: Infectious Diseases

## 2016-12-26 ENCOUNTER — Other Ambulatory Visit: Payer: Self-pay | Admitting: Infectious Diseases

## 2016-12-26 DIAGNOSIS — B20 Human immunodeficiency virus [HIV] disease: Secondary | ICD-10-CM

## 2017-01-03 ENCOUNTER — Ambulatory Visit: Payer: BLUE CROSS/BLUE SHIELD

## 2017-01-04 ENCOUNTER — Encounter: Payer: Self-pay | Admitting: Infectious Diseases

## 2017-03-19 ENCOUNTER — Other Ambulatory Visit: Payer: BLUE CROSS/BLUE SHIELD

## 2017-03-19 ENCOUNTER — Other Ambulatory Visit (HOSPITAL_COMMUNITY)
Admission: RE | Admit: 2017-03-19 | Discharge: 2017-03-19 | Disposition: A | Discharge status: Home / Self Care | Payer: BLUE CROSS/BLUE SHIELD | Source: Ambulatory Visit | Attending: Infectious Diseases | Admitting: Infectious Diseases

## 2017-03-19 DIAGNOSIS — Z79899 Other long term (current) drug therapy: Secondary | ICD-10-CM | POA: Diagnosis not present

## 2017-03-19 DIAGNOSIS — Z113 Encounter for screening for infections with a predominantly sexual mode of transmission: Secondary | ICD-10-CM | POA: Diagnosis not present

## 2017-03-19 DIAGNOSIS — B20 Human immunodeficiency virus [HIV] disease: Secondary | ICD-10-CM | POA: Insufficient documentation

## 2017-03-20 ENCOUNTER — Other Ambulatory Visit: Payer: Self-pay | Admitting: Infectious Diseases

## 2017-03-20 DIAGNOSIS — B20 Human immunodeficiency virus [HIV] disease: Secondary | ICD-10-CM

## 2017-03-20 LAB — URINE CYTOLOGY ANCILLARY ONLY
Chlamydia: NEGATIVE
NEISSERIA GONORRHEA: NEGATIVE

## 2017-03-20 LAB — FLUORESCENT TREPONEMAL AB(FTA)-IGG-BLD: Fluorescent Treponemal ABS: NONREACTIVE

## 2017-03-20 LAB — COMPREHENSIVE METABOLIC PANEL
AG RATIO: 1.5 (calc) (ref 1.0–2.5)
ALBUMIN MSPROF: 4.3 g/dL (ref 3.6–5.1)
ALKALINE PHOSPHATASE (APISO): 96 U/L (ref 40–115)
ALT: 24 U/L (ref 9–46)
AST: 17 U/L (ref 10–35)
BILIRUBIN TOTAL: 0.7 mg/dL (ref 0.2–1.2)
BUN: 18 mg/dL (ref 7–25)
CALCIUM: 9.3 mg/dL (ref 8.6–10.3)
CO2: 30 mmol/L (ref 20–32)
Chloride: 104 mmol/L (ref 98–110)
Creat: 1.07 mg/dL (ref 0.70–1.33)
Globulin: 2.9 g/dL (calc) (ref 1.9–3.7)
Glucose, Bld: 96 mg/dL (ref 65–99)
POTASSIUM: 4.5 mmol/L (ref 3.5–5.3)
SODIUM: 140 mmol/L (ref 135–146)
TOTAL PROTEIN: 7.2 g/dL (ref 6.1–8.1)

## 2017-03-20 LAB — CBC WITH DIFFERENTIAL/PLATELET
Basophils Absolute: 20 cells/uL (ref 0–200)
Basophils Relative: 0.5 %
EOS ABS: 72 {cells}/uL (ref 15–500)
EOS PCT: 1.8 %
HCT: 43.7 % (ref 38.5–50.0)
HEMOGLOBIN: 15.3 g/dL (ref 13.2–17.1)
Lymphs Abs: 980 cells/uL (ref 850–3900)
MCH: 33.9 pg — AB (ref 27.0–33.0)
MCHC: 35 g/dL (ref 32.0–36.0)
MCV: 96.9 fL (ref 80.0–100.0)
MONOS PCT: 9.5 %
MPV: 11.1 fL (ref 7.5–12.5)
NEUTROS ABS: 2548 {cells}/uL (ref 1500–7800)
Neutrophils Relative %: 63.7 %
PLATELETS: 180 10*3/uL (ref 140–400)
RBC: 4.51 10*6/uL (ref 4.20–5.80)
RDW: 11.7 % (ref 11.0–15.0)
Total Lymphocyte: 24.5 %
WBC mixed population: 380 cells/uL (ref 200–950)
WBC: 4 10*3/uL (ref 3.8–10.8)

## 2017-03-20 LAB — T-HELPER CELL (CD4) - (RCID CLINIC ONLY)
CD4 T CELL ABS: 360 /uL — AB (ref 400–2700)
CD4 T CELL HELPER: 30 % — AB (ref 33–55)

## 2017-03-20 LAB — LIPID PANEL
CHOL/HDL RATIO: 3.6 (calc) (ref <5.0)
CHOLESTEROL: 140 mg/dL (ref <200)
HDL: 39 mg/dL — AB (ref >40)
LDL Cholesterol (Calc): 74 mg/dL (calc)
Non-HDL Cholesterol (Calc): 101 mg/dL (calc) (ref <130)
TRIGLYCERIDES: 162 mg/dL — AB (ref <150)

## 2017-03-20 LAB — RPR TITER

## 2017-03-20 LAB — RPR: RPR: REACTIVE — AB

## 2017-03-21 LAB — HIV-1 RNA QUANT-NO REFLEX-BLD
HIV 1 RNA QUANT: DETECTED {copies}/mL — AB
HIV-1 RNA Quant, Log: <1.3 Log copies/mL — AB

## 2017-04-02 ENCOUNTER — Ambulatory Visit (INDEPENDENT_AMBULATORY_CARE_PROVIDER_SITE_OTHER): Payer: BLUE CROSS/BLUE SHIELD | Admitting: Infectious Diseases

## 2017-04-02 VITALS — BP 124/84 | HR 80 | Temp 98.5°F | Wt 221.0 lb

## 2017-04-02 DIAGNOSIS — E785 Hyperlipidemia, unspecified: Secondary | ICD-10-CM

## 2017-04-02 DIAGNOSIS — Z113 Encounter for screening for infections with a predominantly sexual mode of transmission: Secondary | ICD-10-CM

## 2017-04-02 DIAGNOSIS — Z23 Encounter for immunization: Secondary | ICD-10-CM

## 2017-04-02 DIAGNOSIS — B20 Human immunodeficiency virus [HIV] disease: Secondary | ICD-10-CM | POA: Diagnosis not present

## 2017-04-02 DIAGNOSIS — F341 Dysthymic disorder: Secondary | ICD-10-CM

## 2017-04-02 NOTE — Progress Notes (Signed)
   Subjective:    Patient ID: Darrell Estrada, male    DOB: 1960-08-13, 56 y.o.   MRN: [id number]  HPI 56 yo M with hx of hyperlipidemia, nephrolithiasis, and AIDS dx 08-2008 [facility] (CD4 84).  Taking atripla until 06-2015 then changed to odesy, effexor.  Had colonoscopy in June 2015- few polyps, all benign.  No problems with ART.  No further renal stones.  Mood has been good. attributes to effexor.   HIV 1 RNA Quant (copies/mL)  Date Value  03/19/2017 <20 DETECTED (A)  02/29/2016 <20  06/28/2015 <20   CD4 T Cell Abs (/uL)  Date Value  03/19/2017 360 (L)  02/29/2016 310 (L)  06/28/2015 410    Review of Systems  Constitutional: Negative for appetite change, chills, fever and unexpected weight change.  Respiratory: Negative for cough and shortness of breath.   Cardiovascular: Negative for chest pain.  Gastrointestinal: Negative for constipation and diarrhea.  Genitourinary: Negative for difficulty urinating.  Neurological: Negative for headaches.  Psychiatric/Behavioral: Negative for dysphoric mood.  working on wt loss.      Objective:   Physical Exam  Constitutional: He appears well-developed and well-nourished.  HENT:  Mouth/Throat: No oropharyngeal exudate.  Eyes: EOM are normal. Pupils are equal, round, and reactive to light.  Neck: Neck supple.  Cardiovascular: Normal rate, regular rhythm and normal heart sounds.  Pulmonary/Chest: Effort normal and breath sounds normal.  Abdominal: Soft. Bowel sounds are normal. There is no tenderness. There is no rebound.  Musculoskeletal: He exhibits no edema.  Lymphadenopathy:    He has no cervical adenopathy.  Psychiatric: He has a normal mood and affect.      Assessment & Plan:

## 2017-04-02 NOTE — Assessment & Plan Note (Signed)
Doing well on effexor.   

## 2017-04-02 NOTE — Addendum Note (Signed)
Addended by: Lorenso CourierMALDONADO, Stellar Gensel L on: 04/02/2017 04:14 PM   Modules accepted: Orders

## 2017-04-02 NOTE — Assessment & Plan Note (Signed)
Most recent labs show this to be well controlled continue to monitor

## 2017-04-02 NOTE — Assessment & Plan Note (Signed)
Doing very well Has had flu shot Mening vax today Offered/refused condoms.  Partner on prep rtc 1 year.

## 2017-04-19 ENCOUNTER — Other Ambulatory Visit: Payer: Self-pay | Admitting: Infectious Diseases

## 2017-04-19 DIAGNOSIS — B20 Human immunodeficiency virus [HIV] disease: Secondary | ICD-10-CM

## 2017-07-08 ENCOUNTER — Ambulatory Visit: Payer: BLUE CROSS/BLUE SHIELD

## 2017-07-09 ENCOUNTER — Encounter: Payer: Self-pay | Admitting: Infectious Diseases

## 2017-10-14 ENCOUNTER — Other Ambulatory Visit: Payer: Self-pay | Admitting: Infectious Diseases

## 2017-10-14 DIAGNOSIS — B20 Human immunodeficiency virus [HIV] disease: Secondary | ICD-10-CM

## 2017-11-15 ENCOUNTER — Other Ambulatory Visit: Payer: Self-pay | Admitting: Infectious Diseases

## 2017-11-15 DIAGNOSIS — B20 Human immunodeficiency virus [HIV] disease: Secondary | ICD-10-CM

## 2017-12-10 ENCOUNTER — Ambulatory Visit: Payer: BLUE CROSS/BLUE SHIELD

## 2017-12-16 ENCOUNTER — Encounter: Payer: Self-pay | Admitting: Infectious Diseases

## 2018-03-19 ENCOUNTER — Other Ambulatory Visit: Payer: BLUE CROSS/BLUE SHIELD

## 2018-03-19 DIAGNOSIS — B20 Human immunodeficiency virus [HIV] disease: Secondary | ICD-10-CM

## 2018-03-19 DIAGNOSIS — Z113 Encounter for screening for infections with a predominantly sexual mode of transmission: Secondary | ICD-10-CM

## 2018-03-19 DIAGNOSIS — E785 Hyperlipidemia, unspecified: Secondary | ICD-10-CM

## 2018-03-20 LAB — T-HELPER CELL (CD4) - (RCID CLINIC ONLY)
CD4 T CELL HELPER: 33 % (ref 33–55)
CD4 T Cell Abs: 330 /uL — ABNORMAL LOW (ref 400–2700)

## 2018-03-21 LAB — COMPREHENSIVE METABOLIC PANEL
AG RATIO: 1.3 (calc) (ref 1.0–2.5)
ALT: 37 U/L (ref 9–46)
AST: 25 U/L (ref 10–35)
Albumin: 4.4 g/dL (ref 3.6–5.1)
Alkaline phosphatase (APISO): 95 U/L (ref 40–115)
BUN: 19 mg/dL (ref 7–25)
CHLORIDE: 102 mmol/L (ref 98–110)
CO2: 27 mmol/L (ref 20–32)
CREATININE: 1.31 mg/dL (ref 0.70–1.33)
Calcium: 9.6 mg/dL (ref 8.6–10.3)
GLOBULIN: 3.4 g/dL (ref 1.9–3.7)
Glucose, Bld: 124 mg/dL — ABNORMAL HIGH (ref 65–99)
Potassium: 4.1 mmol/L (ref 3.5–5.3)
Sodium: 139 mmol/L (ref 135–146)
Total Bilirubin: 0.6 mg/dL (ref 0.2–1.2)
Total Protein: 7.8 g/dL (ref 6.1–8.1)

## 2018-03-21 LAB — LIPID PANEL
Cholesterol: 165 mg/dL (ref <200)
HDL: 46 mg/dL (ref >40)
LDL CHOLESTEROL (CALC): 96 mg/dL
NON-HDL CHOLESTEROL (CALC): 119 mg/dL (ref <130)
TRIGLYCERIDES: 133 mg/dL (ref <150)
Total CHOL/HDL Ratio: 3.6 (calc) (ref <5.0)

## 2018-03-21 LAB — CBC
HEMATOCRIT: 46.1 % (ref 38.5–50.0)
HEMOGLOBIN: 16.1 g/dL (ref 13.2–17.1)
MCH: 33.4 pg — AB (ref 27.0–33.0)
MCHC: 34.9 g/dL (ref 32.0–36.0)
MCV: 95.6 fL (ref 80.0–100.0)
MPV: 10.6 fL (ref 7.5–12.5)
Platelets: 193 10*3/uL (ref 140–400)
RBC: 4.82 10*6/uL (ref 4.20–5.80)
RDW: 11.8 % (ref 11.0–15.0)
WBC: 5.3 10*3/uL (ref 3.8–10.8)

## 2018-03-21 LAB — RPR: RPR Ser Ql: NONREACTIVE

## 2018-03-21 LAB — HIV-1 RNA QUANT-NO REFLEX-BLD
HIV 1 RNA QUANT: NOT DETECTED {copies}/mL
HIV-1 RNA QUANT, LOG: NOT DETECTED {Log_copies}/mL

## 2018-04-01 ENCOUNTER — Encounter: Payer: Self-pay | Admitting: Infectious Diseases

## 2018-04-01 ENCOUNTER — Ambulatory Visit: Payer: BLUE CROSS/BLUE SHIELD | Admitting: Infectious Diseases

## 2018-04-01 ENCOUNTER — Ambulatory Visit (INDEPENDENT_AMBULATORY_CARE_PROVIDER_SITE_OTHER): Payer: BLUE CROSS/BLUE SHIELD | Admitting: Infectious Diseases

## 2018-04-01 VITALS — BP 145/82 | HR 72 | Temp 98.6°F | Wt 215.0 lb

## 2018-04-01 DIAGNOSIS — N200 Calculus of kidney: Secondary | ICD-10-CM | POA: Diagnosis not present

## 2018-04-01 DIAGNOSIS — B20 Human immunodeficiency virus [HIV] disease: Secondary | ICD-10-CM

## 2018-04-01 DIAGNOSIS — E669 Obesity, unspecified: Secondary | ICD-10-CM

## 2018-04-01 DIAGNOSIS — F321 Major depressive disorder, single episode, moderate: Secondary | ICD-10-CM

## 2018-04-01 DIAGNOSIS — Z23 Encounter for immunization: Secondary | ICD-10-CM | POA: Diagnosis not present

## 2018-04-01 DIAGNOSIS — Z79899 Other long term (current) drug therapy: Secondary | ICD-10-CM

## 2018-04-01 DIAGNOSIS — Z113 Encounter for screening for infections with a predominantly sexual mode of transmission: Secondary | ICD-10-CM

## 2018-04-01 NOTE — Addendum Note (Signed)
Addended by: Gerarda Fraction on: 04/01/2018 02:23 PM   Modules accepted: Orders

## 2018-04-01 NOTE — Assessment & Plan Note (Signed)
Improved (with wt loss?) , no rx.

## 2018-04-01 NOTE — Assessment & Plan Note (Signed)
Encouraged him to work on diet.

## 2018-04-01 NOTE — Progress Notes (Signed)
   Subjective:    Patient ID: Darrell Estrada, male    DOB: Mar 30, 1961, 57 y.o.   MRN: [id number]  HPI 57 yo M with hx of hyperlipidemia, nephrolithiasis,and AIDS dx 08-2008 [facility] (CD4 84).  Taking atriplauntil 06-2015 then changed to odesy, effexor.  Had colonoscopy in June 2015- few polyps, all benign. Has been well, taking care of mother.  Mood has been good, still taking effexor. Prev saw counselor but has quit.   Has been trying to lose wt, lost 8# over the summer. Was down to 205-204. Had quit carbs and soft drinks.   HIV 1 RNA Quant (copies/mL)  Date Value  03/19/2018 <20 NOT DETECTED  03/19/2017 <20 DETECTED (A)  02/29/2016 <20   CD4 T Cell Abs (/uL)  Date Value  03/19/2018 330 (L)  03/19/2017 360 (L)  02/29/2016 310 (L)    Review of Systems  Constitutional: Negative for appetite change and unexpected weight change.  Respiratory: Negative for cough and shortness of breath.   Cardiovascular: Negative for chest pain.  Gastrointestinal: Negative for constipation and diarrhea.  Genitourinary: Negative for difficulty urinating.  Neurological: Negative for headaches.  Psychiatric/Behavioral: Negative for dysphoric mood.  Please see HPI. All other systems reviewed and negative. No further kidney stones.       Objective:   Physical Exam        Assessment & Plan:

## 2018-04-01 NOTE — Assessment & Plan Note (Signed)
Doing well, no recurrences.  Will mark as resolved.

## 2018-04-01 NOTE — Assessment & Plan Note (Signed)
Doing well on effexor.  Will refer him to counseling when he needs.

## 2018-04-01 NOTE — Assessment & Plan Note (Signed)
He is doing well Has had flu vax Will give pcv Offered/refused condoms.  Will return to clinic in 1 year.

## 2018-04-02 ENCOUNTER — Ambulatory Visit: Payer: BLUE CROSS/BLUE SHIELD | Admitting: Infectious Diseases

## 2018-05-15 ENCOUNTER — Other Ambulatory Visit: Payer: Self-pay | Admitting: Infectious Diseases

## 2018-05-15 DIAGNOSIS — B20 Human immunodeficiency virus [HIV] disease: Secondary | ICD-10-CM

## 2018-06-03 ENCOUNTER — Ambulatory Visit: Payer: Self-pay

## 2018-07-01 ENCOUNTER — Encounter: Payer: Self-pay | Admitting: Infectious Diseases

## 2018-11-18 ENCOUNTER — Other Ambulatory Visit: Payer: Self-pay | Admitting: Infectious Diseases

## 2018-11-18 DIAGNOSIS — B20 Human immunodeficiency virus [HIV] disease: Secondary | ICD-10-CM

## 2018-12-02 ENCOUNTER — Other Ambulatory Visit: Payer: Self-pay

## 2018-12-02 ENCOUNTER — Ambulatory Visit: Payer: Self-pay

## 2019-04-02 ENCOUNTER — Other Ambulatory Visit: Payer: PRIVATE HEALTH INSURANCE

## 2019-04-02 ENCOUNTER — Other Ambulatory Visit: Payer: Self-pay

## 2019-04-02 DIAGNOSIS — Z113 Encounter for screening for infections with a predominantly sexual mode of transmission: Secondary | ICD-10-CM

## 2019-04-02 DIAGNOSIS — B20 Human immunodeficiency virus [HIV] disease: Secondary | ICD-10-CM

## 2019-04-02 DIAGNOSIS — Z79899 Other long term (current) drug therapy: Secondary | ICD-10-CM

## 2019-04-02 NOTE — Addendum Note (Signed)
Addended by: Dolan Amen D on: 04/02/2019 09:08 AM   Modules accepted: Orders

## 2019-04-03 LAB — T-HELPER CELL (CD4) - (RCID CLINIC ONLY)
CD4 % Helper T Cell: 38 % (ref 33–65)
CD4 T Cell Abs: 417 /uL (ref 400–1790)

## 2019-04-05 LAB — CBC
HCT: 46.6 % (ref 38.5–50.0)
Hemoglobin: 16.1 g/dL (ref 13.2–17.1)
MCH: 33.4 pg — ABNORMAL HIGH (ref 27.0–33.0)
MCHC: 34.5 g/dL (ref 32.0–36.0)
MCV: 96.7 fL (ref 80.0–100.0)
MPV: 11.1 fL (ref 7.5–12.5)
Platelets: 193 10*3/uL (ref 140–400)
RBC: 4.82 10*6/uL (ref 4.20–5.80)
RDW: 11.7 % (ref 11.0–15.0)
WBC: 5.3 10*3/uL (ref 3.8–10.8)

## 2019-04-05 LAB — COMPREHENSIVE METABOLIC PANEL
AG Ratio: 1.3 (calc) (ref 1.0–2.5)
ALT: 53 U/L — ABNORMAL HIGH (ref 9–46)
AST: 33 U/L (ref 10–35)
Albumin: 4.3 g/dL (ref 3.6–5.1)
Alkaline phosphatase (APISO): 102 U/L (ref 35–144)
BUN: 15 mg/dL (ref 7–25)
CO2: 28 mmol/L (ref 20–32)
Calcium: 9.6 mg/dL (ref 8.6–10.3)
Chloride: 106 mmol/L (ref 98–110)
Creat: 1.12 mg/dL (ref 0.70–1.33)
Globulin: 3.2 g/dL (calc) (ref 1.9–3.7)
Glucose, Bld: 117 mg/dL — ABNORMAL HIGH (ref 65–99)
Potassium: 4.2 mmol/L (ref 3.5–5.3)
Sodium: 140 mmol/L (ref 135–146)
Total Bilirubin: 0.4 mg/dL (ref 0.2–1.2)
Total Protein: 7.5 g/dL (ref 6.1–8.1)

## 2019-04-05 LAB — LIPID PANEL
Cholesterol: 118 mg/dL (ref <200)
HDL: 33 mg/dL — ABNORMAL LOW (ref >=40)
LDL Cholesterol (Calc): 61 mg/dL (calc)
Non-HDL Cholesterol (Calc): 85 mg/dL (calc) (ref <130)
Total CHOL/HDL Ratio: 3.6 (calc) (ref <5.0)
Triglycerides: 162 mg/dL — ABNORMAL HIGH (ref <150)

## 2019-04-05 LAB — RPR: RPR Ser Ql: REACTIVE — AB

## 2019-04-05 LAB — FLUORESCENT TREPONEMAL AB(FTA)-IGG-BLD: Fluorescent Treponemal ABS: NONREACTIVE

## 2019-04-05 LAB — RPR TITER: RPR Titer: 1:1 {titer} — ABNORMAL HIGH

## 2019-04-05 LAB — HIV-1 RNA QUANT-NO REFLEX-BLD
HIV 1 RNA Quant: <20 copies/mL — AB
HIV-1 RNA Quant, Log: <1.3 Log copies/mL — AB

## 2019-04-17 ENCOUNTER — Encounter: Payer: BLUE CROSS/BLUE SHIELD | Admitting: Infectious Diseases

## 2019-04-17 ENCOUNTER — Ambulatory Visit: Payer: Self-pay | Admitting: Infectious Diseases

## 2019-05-01 ENCOUNTER — Ambulatory Visit: Payer: Self-pay | Admitting: Infectious Diseases

## 2019-05-15 ENCOUNTER — Encounter: Payer: Self-pay | Admitting: Infectious Diseases

## 2019-05-15 ENCOUNTER — Other Ambulatory Visit: Payer: Self-pay

## 2019-05-15 ENCOUNTER — Ambulatory Visit (INDEPENDENT_AMBULATORY_CARE_PROVIDER_SITE_OTHER): Payer: PRIVATE HEALTH INSURANCE | Admitting: Infectious Diseases

## 2019-05-15 VITALS — BP 141/87 | HR 63 | Wt 228.2 lb

## 2019-05-15 DIAGNOSIS — Z79899 Other long term (current) drug therapy: Secondary | ICD-10-CM

## 2019-05-15 DIAGNOSIS — F321 Major depressive disorder, single episode, moderate: Secondary | ICD-10-CM | POA: Diagnosis not present

## 2019-05-15 DIAGNOSIS — E669 Obesity, unspecified: Secondary | ICD-10-CM | POA: Diagnosis not present

## 2019-05-15 DIAGNOSIS — E785 Hyperlipidemia, unspecified: Secondary | ICD-10-CM

## 2019-05-15 DIAGNOSIS — B20 Human immunodeficiency virus [HIV] disease: Secondary | ICD-10-CM

## 2019-05-15 DIAGNOSIS — Z113 Encounter for screening for infections with a predominantly sexual mode of transmission: Secondary | ICD-10-CM

## 2019-05-15 NOTE — Assessment & Plan Note (Signed)
Encouraged him to exercise, watch diet.

## 2019-05-15 NOTE — Assessment & Plan Note (Signed)
Doing well Offered/refused condoms.  Needs [id number] and shingles, will send to outpt pharm.  Will see back in 1 year.  Labs prior.

## 2019-05-15 NOTE — Assessment & Plan Note (Signed)
Lab Results  Component Value Date   CHOL 118 04/02/2019   HDL 33 (L) 04/02/2019   LDLCALC 61 04/02/2019   TRIG 162 (H) 04/02/2019   CHOLHDL 3.6 04/02/2019    Well controlled.

## 2019-05-15 NOTE — Assessment & Plan Note (Signed)
Doing well.  Taking effexor, has tried unsuccessfully to wean. Prev panic attacks.  Will continue.

## 2019-05-15 NOTE — Progress Notes (Signed)
   Subjective:    Patient ID: Darrell Estrada, male    DOB: [birth year], 58 y.o.   MRN: [id number]  HPI 58yo M with hx of hyperlipidemia, nephrolithiasis,and AIDS dx 08-2008 [facility] (CD4 84).  Taking atriplauntil 06-2015 then changed to odesy, effexor.  Had colonoscopy in June 2015- few polyps, all benign.Had repeat done as well. Next shced in 5 years.  Has gained 13# since last year. Plan is to stop eating junk food. Start going back to the gym.  Mood has been "great".  No holiday plans, will see his mother, stay with his partner. Partner on prep.   HIV 1 RNA Quant (copies/mL)  Date Value  04/02/2019 <20 DETECTED (A)  03/19/2018 <20 NOT DETECTED  03/19/2017 <20 DETECTED (A)   CD4 T Cell Abs (/uL)  Date Value  04/02/2019 417  03/19/2018 330 (L)  03/19/2017 360 (L)    Review of Systems  Constitutional: Positive for unexpected weight change. Negative for appetite change, chills and fatigue.  Respiratory: Negative for cough and choking.   Gastrointestinal: Negative for constipation and diarrhea.  Genitourinary: Negative for difficulty urinating.  Psychiatric/Behavioral: Negative for dysphoric mood.  Please see HPI. All other systems reviewed and negative.       Objective:   Physical Exam Vitals reviewed.  Constitutional:      Appearance: Normal appearance. He is obese.  HENT:     Mouth/Throat:     Mouth: Mucous membranes are moist.     Pharynx: No oropharyngeal exudate.  Eyes:     Extraocular Movements: Extraocular movements intact.     Pupils: Pupils are equal, round, and reactive to light.  Cardiovascular:     Rate and Rhythm: Normal rate and regular rhythm.  Pulmonary:     Effort: Pulmonary effort is normal.     Breath sounds: Normal breath sounds.  Abdominal:     General: There is no distension.     Palpations: Abdomen is soft.     Tenderness: There is no abdominal tenderness.  Musculoskeletal:     Cervical back: Normal range of motion.     Right lower  leg: No edema.     Left lower leg: No edema.  Neurological:     General: No focal deficit present.     Mental Status: He is alert.  Psychiatric:        Mood and Affect: Mood normal.           Assessment & Plan:

## 2019-05-18 ENCOUNTER — Other Ambulatory Visit: Payer: Self-pay

## 2019-05-18 DIAGNOSIS — B20 Human immunodeficiency virus [HIV] disease: Secondary | ICD-10-CM

## 2019-05-18 MED ORDER — ODEFSEY 200-25-25 MG PO TABS: 1.0000 | ORAL_TABLET | Freq: Every day | ORAL | 5 refills | Status: DC

## 2019-05-26 ENCOUNTER — Encounter: Payer: Self-pay | Admitting: Infectious Diseases

## 2019-06-01 ENCOUNTER — Ambulatory Visit: Payer: Self-pay

## 2019-06-24 ENCOUNTER — Telehealth: Payer: Self-pay

## 2019-06-24 NOTE — Telephone Encounter (Signed)
Called patient to confirm insurance information for PA. Patient will send mychart message with insurance information later today. PA pending  Lorenso Courier, CMA

## 2019-06-24 NOTE — Telephone Encounter (Signed)
Unable to confirm patient's information through covermymeds. Hard copy will be faxed to office later today. Will continue to follow Lorenso Courier, CMA

## 2019-06-25 NOTE — Telephone Encounter (Signed)
PA approved from 06-25-19 to 06/24/20. Approval faxed to pharmacy. Lorenso Courier, [place]

## 2019-06-29 ENCOUNTER — Ambulatory Visit: Payer: 59

## 2019-06-29 ENCOUNTER — Other Ambulatory Visit: Payer: Self-pay

## 2019-07-13 ENCOUNTER — Encounter: Payer: Self-pay | Admitting: Infectious Diseases

## 2019-10-11 ENCOUNTER — Emergency Department (HOSPITAL_COMMUNITY)
Admission: EM | Admit: 2019-10-11 | Discharge: 2019-10-11 | Disposition: A | Discharge status: Home / Self Care | Payer: 59 | Attending: Emergency Medicine | Admitting: Emergency Medicine

## 2019-10-11 ENCOUNTER — Other Ambulatory Visit: Payer: Self-pay

## 2019-10-11 ENCOUNTER — Emergency Department (HOSPITAL_COMMUNITY): Payer: 59

## 2019-10-11 ENCOUNTER — Encounter (HOSPITAL_COMMUNITY): Payer: Self-pay | Admitting: Pediatrics

## 2019-10-11 DIAGNOSIS — Y92410 Unspecified street and highway as the place of occurrence of the external cause: Secondary | ICD-10-CM | POA: Insufficient documentation

## 2019-10-11 DIAGNOSIS — Y9389 Activity, other specified: Secondary | ICD-10-CM | POA: Diagnosis not present

## 2019-10-11 DIAGNOSIS — R109 Unspecified abdominal pain: Secondary | ICD-10-CM | POA: Diagnosis not present

## 2019-10-11 DIAGNOSIS — Z79899 Other long term (current) drug therapy: Secondary | ICD-10-CM | POA: Diagnosis not present

## 2019-10-11 DIAGNOSIS — B20 Human immunodeficiency virus [HIV] disease: Secondary | ICD-10-CM | POA: Insufficient documentation

## 2019-10-11 DIAGNOSIS — M546 Pain in thoracic spine: Secondary | ICD-10-CM | POA: Insufficient documentation

## 2019-10-11 DIAGNOSIS — W2210XA Striking against or struck by unspecified automobile airbag, initial encounter: Secondary | ICD-10-CM | POA: Insufficient documentation

## 2019-10-11 DIAGNOSIS — Y998 Other external cause status: Secondary | ICD-10-CM | POA: Insufficient documentation

## 2019-10-11 HISTORY — DX: Calculus of kidney: N20.0

## 2019-10-11 LAB — TYPE AND SCREEN
ABO/RH(D): O POS
Antibody Screen: NEGATIVE

## 2019-10-11 LAB — CBC WITH DIFFERENTIAL/PLATELET
Abs Immature Granulocytes: 0.06 10*3/uL (ref 0.00–0.07)
Basophils Absolute: 0 10*3/uL (ref 0.0–0.1)
Basophils Relative: 0 %
Eosinophils Absolute: 0 10*3/uL (ref 0.0–0.5)
Eosinophils Relative: 0 %
HCT: 46.3 % (ref 39.0–52.0)
Hemoglobin: 16 g/dL (ref 13.0–17.0)
Immature Granulocytes: 1 %
Lymphocytes Relative: 19 %
Lymphs Abs: 1.3 10*3/uL (ref 0.7–4.0)
MCH: 33.3 pg (ref 26.0–34.0)
MCHC: 34.6 g/dL (ref 30.0–36.0)
MCV: 96.5 fL (ref 80.0–100.0)
Monocytes Absolute: 0.7 10*3/uL (ref 0.1–1.0)
Monocytes Relative: 10 %
Neutro Abs: 4.9 10*3/uL (ref 1.7–7.7)
Neutrophils Relative %: 70 %
Platelets: 197 10*3/uL (ref 150–400)
RBC: 4.8 MIL/uL (ref 4.22–5.81)
RDW: 11.6 % (ref 11.5–15.5)
WBC: 7.1 10*3/uL (ref 4.0–10.5)
nRBC: 0 % (ref 0.0–0.2)

## 2019-10-11 LAB — COMPREHENSIVE METABOLIC PANEL
ALT: 34 U/L (ref 0–44)
AST: 29 U/L (ref 15–41)
Albumin: 4.3 g/dL (ref 3.5–5.0)
Alkaline Phosphatase: 99 U/L (ref 38–126)
Anion gap: 13 (ref 5–15)
BUN: 18 mg/dL (ref 6–20)
CO2: 21 mmol/L — ABNORMAL LOW (ref 22–32)
Calcium: 9.6 mg/dL (ref 8.9–10.3)
Chloride: 106 mmol/L (ref 98–111)
Creatinine, Ser: 1.22 mg/dL (ref 0.61–1.24)
GFR calc Af Amer: >60 mL/min (ref >60)
GFR calc non Af Amer: >60 mL/min (ref >60)
Glucose, Bld: 142 mg/dL — ABNORMAL HIGH (ref 70–99)
Potassium: 4.2 mmol/L (ref 3.5–5.1)
Sodium: 140 mmol/L (ref 135–145)
Total Bilirubin: 0.7 mg/dL (ref 0.3–1.2)
Total Protein: 8 g/dL (ref 6.5–8.1)

## 2019-10-11 LAB — PROTIME-INR
INR: 0.9 (ref 0.8–1.2)
Prothrombin Time: 12.2 seconds (ref 11.4–15.2)

## 2019-10-11 LAB — LIPASE, BLOOD: Lipase: 36 U/L (ref 11–51)

## 2019-10-11 LAB — ABO/RH: ABO/RH(D): O POS

## 2019-10-11 MED ORDER — IOHEXOL 300 MG/ML  SOLN
100.0000 mL | Freq: Once | INTRAMUSCULAR | Status: AC | PRN
  Administered 2019-10-11: 100 mL via INTRAVENOUS
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
## 2019-10-11 NOTE — Discharge Instructions (Addendum)
Please return for any problem.  Follow-up with your regular care provider as instructed.  Use ice and Tylenol as instructed for pain.

## 2019-10-11 NOTE — ED Notes (Signed)
Patient verbalizes understanding of discharge instructions. Opportunity for questioning and answers were provided. Armband removed by staff, pt discharged from ED and ambulated to lobby to return home with family.  

## 2019-11-29 ENCOUNTER — Other Ambulatory Visit: Payer: Self-pay | Admitting: Infectious Diseases

## 2019-11-29 DIAGNOSIS — B20 Human immunodeficiency virus [HIV] disease: Secondary | ICD-10-CM

## 2020-02-03 ENCOUNTER — Ambulatory Visit: Payer: 59

## 2020-02-03 ENCOUNTER — Other Ambulatory Visit: Payer: Self-pay

## 2020-03-04 ENCOUNTER — Encounter: Payer: Self-pay | Admitting: Infectious Diseases

## 2020-04-29 ENCOUNTER — Other Ambulatory Visit: Payer: Self-pay

## 2020-04-29 ENCOUNTER — Other Ambulatory Visit: Payer: 59

## 2020-04-29 DIAGNOSIS — Z113 Encounter for screening for infections with a predominantly sexual mode of transmission: Secondary | ICD-10-CM

## 2020-04-29 DIAGNOSIS — E785 Hyperlipidemia, unspecified: Secondary | ICD-10-CM

## 2020-04-29 DIAGNOSIS — B20 Human immunodeficiency virus [HIV] disease: Secondary | ICD-10-CM

## 2020-04-29 DIAGNOSIS — Z79899 Other long term (current) drug therapy: Secondary | ICD-10-CM

## 2020-04-29 LAB — T-HELPER CELL (CD4) - (RCID CLINIC ONLY)
CD4 % Helper T Cell: 36 % (ref 33–65)
CD4 T Cell Abs: 277 /uL — ABNORMAL LOW (ref 400–1790)

## 2020-05-03 LAB — RPR: RPR Ser Ql: NONREACTIVE

## 2020-05-03 LAB — CBC
HCT: 46.7 % (ref 38.5–50.0)
Hemoglobin: 16.4 g/dL (ref 13.2–17.1)
MCH: 34.5 pg — ABNORMAL HIGH (ref 27.0–33.0)
MCHC: 35.1 g/dL (ref 32.0–36.0)
MCV: 98.1 fL (ref 80.0–100.0)
MPV: 10.8 fL (ref 7.5–12.5)
Platelets: 186 10*3/uL (ref 140–400)
RBC: 4.76 10*6/uL (ref 4.20–5.80)
RDW: 11.5 % (ref 11.0–15.0)
WBC: 4 10*3/uL (ref 3.8–10.8)

## 2020-05-03 LAB — COMPREHENSIVE METABOLIC PANEL
AG Ratio: 1.3 (calc) (ref 1.0–2.5)
ALT: 28 U/L (ref 9–46)
AST: 19 U/L (ref 10–35)
Albumin: 4.2 g/dL (ref 3.6–5.1)
Alkaline phosphatase (APISO): 107 U/L (ref 35–144)
BUN: 21 mg/dL (ref 7–25)
CO2: 28 mmol/L (ref 20–32)
Calcium: 9.5 mg/dL (ref 8.6–10.3)
Chloride: 101 mmol/L (ref 98–110)
Creat: 1.11 mg/dL (ref 0.70–1.33)
Globulin: 3.3 g/dL (calc) (ref 1.9–3.7)
Glucose, Bld: 324 mg/dL — ABNORMAL HIGH (ref 65–99)
Potassium: 4.6 mmol/L (ref 3.5–5.3)
Sodium: 139 mmol/L (ref 135–146)
Total Bilirubin: 0.6 mg/dL (ref 0.2–1.2)
Total Protein: 7.5 g/dL (ref 6.1–8.1)

## 2020-05-03 LAB — HIV-1 RNA QUANT-NO REFLEX-BLD
HIV 1 RNA Quant: <20 Copies/mL
HIV-1 RNA Quant, Log: <1.3 Log cps/mL

## 2020-05-03 LAB — LIPID PANEL
Cholesterol: 159 mg/dL (ref <200)
HDL: 39 mg/dL — ABNORMAL LOW (ref >=40)
LDL Cholesterol (Calc): 84 mg/dL (calc)
Non-HDL Cholesterol (Calc): 120 mg/dL (calc) (ref <130)
Total CHOL/HDL Ratio: 4.1 (calc) (ref <5.0)
Triglycerides: 271 mg/dL — ABNORMAL HIGH (ref <150)

## 2020-05-13 ENCOUNTER — Encounter: Payer: PRIVATE HEALTH INSURANCE | Admitting: Infectious Diseases

## 2020-05-17 ENCOUNTER — Ambulatory Visit (INDEPENDENT_AMBULATORY_CARE_PROVIDER_SITE_OTHER): Payer: 59 | Admitting: Infectious Diseases

## 2020-05-17 ENCOUNTER — Other Ambulatory Visit: Payer: Self-pay

## 2020-05-17 ENCOUNTER — Encounter: Payer: Self-pay | Admitting: Infectious Diseases

## 2020-05-17 VITALS — BP 164/83 | HR 74 | Temp 98.3°F | Wt 225.0 lb

## 2020-05-17 DIAGNOSIS — B20 Human immunodeficiency virus [HIV] disease: Secondary | ICD-10-CM

## 2020-05-17 DIAGNOSIS — Z113 Encounter for screening for infections with a predominantly sexual mode of transmission: Secondary | ICD-10-CM | POA: Diagnosis not present

## 2020-05-17 DIAGNOSIS — Z79899 Other long term (current) drug therapy: Secondary | ICD-10-CM | POA: Diagnosis not present

## 2020-05-17 DIAGNOSIS — E669 Obesity, unspecified: Secondary | ICD-10-CM

## 2020-05-17 DIAGNOSIS — Z23 Encounter for immunization: Secondary | ICD-10-CM

## 2020-05-17 DIAGNOSIS — R739 Hyperglycemia, unspecified: Secondary | ICD-10-CM

## 2020-05-17 NOTE — Progress Notes (Signed)
   Subjective:    Patient ID: Darrell Estrada, male    DOB: [birth year], 59 y.o.   MRN: [id number]  HPI 59yo M with hx of hyperlipidemia, nephrolithiasis,and AIDS dx 08-2008 [facility] (CD4 84).  Taking atriplauntil 06-2015 then changed to odesy, effexor.  Had colonoscopy in June 2015- few polyps, all benign. Had repeat done as well. Next shced in 5 years (2025).   In the last year his sister and mother have both had CVA.   Partner on prep.  Having family over for holidays. Everyone has been vaccinated.  No problems with his meds.   HIV 1 RNA Quant  Date Value  04/29/2020 <20 Copies/mL  04/02/2019 <20 DETECTED copies/mL (A)  03/19/2018 <20 NOT DETECTED copies/mL   CD4 T Cell Abs (/uL)  Date Value  04/29/2020 277 (L)  04/02/2019 417  03/19/2018 330 (L)     Review of Systems  Constitutional: Negative for appetite change and unexpected weight change.  HENT: Positive for sinus pressure and sinus pain.   Cardiovascular: Negative for chest pain.  Gastrointestinal: Negative for constipation and diarrhea.  Genitourinary: Negative for difficulty urinating.  Neurological: Positive for headaches.  Psychiatric/Behavioral: Positive for dysphoric mood.       Objective:   Physical Exam Vitals reviewed.  Constitutional:      Appearance: Normal appearance. He is obese.  HENT:     Mouth/Throat:     Mouth: Mucous membranes are moist.     Pharynx: No oropharyngeal exudate.  Eyes:     Extraocular Movements: Extraocular movements intact.     Pupils: Pupils are equal, round, and reactive to light.  Cardiovascular:     Rate and Rhythm: Normal rate and regular rhythm.  Pulmonary:     Effort: Pulmonary effort is normal.     Breath sounds: Normal breath sounds.  Abdominal:     General: Bowel sounds are normal. There is no distension.     Palpations: Abdomen is soft.     Tenderness: There is no abdominal tenderness.  Musculoskeletal:     Right lower leg: No edema.     Left lower leg:  No edema.  Neurological:     General: No focal deficit present.     Mental Status: He is alert.  Psychiatric:        Mood and Affect: Mood normal.           Assessment & Plan:

## 2020-05-17 NOTE — Assessment & Plan Note (Signed)
He is doing well Partner on prep Has completed his COVID series.  Will see him back in 1 year. Labs prior Last PCV23 til 65 today.  Colon up to date.

## 2020-05-17 NOTE — Addendum Note (Signed)
Addended by: Tressa Busman T on: 05/17/2020 04:50 PM   Modules accepted: Orders

## 2020-05-17 NOTE — Assessment & Plan Note (Signed)
Will ask his PCP to f/u.  I let him know he is at risk of DM He needs to lose wt and exercise.

## 2020-05-17 NOTE — Assessment & Plan Note (Addendum)
He is going to start keto diet next year.  His last Glc was quite elevated. Will ask his PCP to f/u.  I encouraged him to exercise.

## 2020-05-27 ENCOUNTER — Other Ambulatory Visit: Payer: Self-pay | Admitting: Infectious Diseases

## 2020-05-27 DIAGNOSIS — B20 Human immunodeficiency virus [HIV] disease: Secondary | ICD-10-CM

## 2020-06-29 ENCOUNTER — Telehealth: Payer: Self-pay

## 2020-06-29 NOTE — Telephone Encounter (Signed)
[facility] Patient Advocate Encounter   Received notification from [facility] that prior authorization for ODFESY is required.   PA submitted on 06/29/20 Key [id number] Status is pending  Once PA is Approved call [facility] @[phone] Rx [phone].    [facility] will continue to follow.   #[id number], CPhT Specialty Pharmacy Patient [facility] for Infectious Disease Phone: [phone] Fax:  [phone]

## 2020-07-01 ENCOUNTER — Telehealth: Payer: Self-pay

## 2020-07-01 NOTE — Telephone Encounter (Signed)
[facility] Patient Advocate Encounter  Prior Authorization for Darrell Estrada has been approved.    PA# [id number] Effective dates: 07/01/20 through 06/30/21  Patients co-pay is $632.00.   I was able to get a [employer] Card to make the copay $0.00       [facility] will continue to follow.  Clearance Coots, CPhT Specialty Pharmacy Patient [facility] for Infectious Disease Phone: [phone] Fax:  [phone]

## 2020-08-26 DIAGNOSIS — G459 Transient cerebral ischemic attack, unspecified: Secondary | ICD-10-CM | POA: Insufficient documentation

## 2020-11-23 ENCOUNTER — Other Ambulatory Visit: Payer: Self-pay | Admitting: Infectious Diseases

## 2020-11-23 DIAGNOSIS — B20 Human immunodeficiency virus [HIV] disease: Secondary | ICD-10-CM

## 2021-02-01 ENCOUNTER — Other Ambulatory Visit: Payer: Self-pay

## 2021-02-01 ENCOUNTER — Ambulatory Visit: Payer: 59

## 2021-02-10 ENCOUNTER — Encounter: Payer: Self-pay | Admitting: Infectious Diseases

## 2021-05-01 ENCOUNTER — Other Ambulatory Visit: Payer: Self-pay

## 2021-05-01 ENCOUNTER — Other Ambulatory Visit: Payer: 59

## 2021-05-01 DIAGNOSIS — R739 Hyperglycemia, unspecified: Secondary | ICD-10-CM

## 2021-05-01 DIAGNOSIS — E669 Obesity, unspecified: Secondary | ICD-10-CM

## 2021-05-01 DIAGNOSIS — B20 Human immunodeficiency virus [HIV] disease: Secondary | ICD-10-CM

## 2021-05-01 DIAGNOSIS — Z79899 Other long term (current) drug therapy: Secondary | ICD-10-CM

## 2021-05-01 DIAGNOSIS — Z113 Encounter for screening for infections with a predominantly sexual mode of transmission: Secondary | ICD-10-CM

## 2021-05-02 LAB — T-HELPER CELL (CD4) - (RCID CLINIC ONLY)
CD4 % Helper T Cell: 35 % (ref 33–65)
CD4 T Cell Abs: 296 /uL — ABNORMAL LOW (ref 400–1790)

## 2021-05-04 LAB — CBC
HCT: 45 % (ref 38.5–50.0)
Hemoglobin: 15.4 g/dL (ref 13.2–17.1)
MCH: 33.7 pg — ABNORMAL HIGH (ref 27.0–33.0)
MCHC: 34.2 g/dL (ref 32.0–36.0)
MCV: 98.5 fL (ref 80.0–100.0)
MPV: 10.6 fL (ref 7.5–12.5)
Platelets: 201 10*3/uL (ref 140–400)
RBC: 4.57 10*6/uL (ref 4.20–5.80)
RDW: 11.5 % (ref 11.0–15.0)
WBC: 4.5 10*3/uL (ref 3.8–10.8)

## 2021-05-04 LAB — COMPREHENSIVE METABOLIC PANEL
AG Ratio: 1.5 (calc) (ref 1.0–2.5)
ALT: 27 U/L (ref 9–46)
AST: 19 U/L (ref 10–35)
Albumin: 4.4 g/dL (ref 3.6–5.1)
Alkaline phosphatase (APISO): 97 U/L (ref 35–144)
BUN: 21 mg/dL (ref 7–25)
CO2: 29 mmol/L (ref 20–32)
Calcium: 10 mg/dL (ref 8.6–10.3)
Chloride: 102 mmol/L (ref 98–110)
Creat: 1.07 mg/dL (ref 0.70–1.30)
Globulin: 3 g/dL (calc) (ref 1.9–3.7)
Glucose, Bld: 138 mg/dL — ABNORMAL HIGH (ref 65–99)
Potassium: 4.8 mmol/L (ref 3.5–5.3)
Sodium: 140 mmol/L (ref 135–146)
Total Bilirubin: 0.7 mg/dL (ref 0.2–1.2)
Total Protein: 7.4 g/dL (ref 6.1–8.1)

## 2021-05-04 LAB — LIPID PANEL
Cholesterol: 101 mg/dL (ref <200)
HDL: 40 mg/dL (ref >=40)
LDL Cholesterol (Calc): 34 mg/dL (calc)
Non-HDL Cholesterol (Calc): 61 mg/dL (calc) (ref <130)
Total CHOL/HDL Ratio: 2.5 (calc) (ref <5.0)
Triglycerides: 209 mg/dL — ABNORMAL HIGH (ref <150)

## 2021-05-04 LAB — RPR TITER: RPR Titer: 1:1 {titer} — ABNORMAL HIGH

## 2021-05-04 LAB — FLUORESCENT TREPONEMAL AB(FTA)-IGG-BLD: Fluorescent Treponemal ABS: NONREACTIVE

## 2021-05-04 LAB — HEMOGLOBIN A1C
Hgb A1c MFr Bld: 7.8 % of total Hgb — ABNORMAL HIGH (ref <5.7)
Mean Plasma Glucose: 177 mg/dL
eAG (mmol/L): 9.8 mmol/L

## 2021-05-04 LAB — HIV-1 RNA QUANT-NO REFLEX-BLD
HIV 1 RNA Quant: <20 Copies/mL — ABNORMAL HIGH
HIV-1 RNA Quant, Log: <1.3 Log cps/mL — ABNORMAL HIGH

## 2021-05-04 LAB — RPR: RPR Ser Ql: REACTIVE — AB

## 2021-05-18 ENCOUNTER — Other Ambulatory Visit: Payer: Self-pay | Admitting: Infectious Diseases

## 2021-05-18 DIAGNOSIS — B20 Human immunodeficiency virus [HIV] disease: Secondary | ICD-10-CM

## 2021-05-18 NOTE — Telephone Encounter (Signed)
Appt 12/23

## 2021-05-19 ENCOUNTER — Other Ambulatory Visit: Payer: Self-pay

## 2021-05-19 ENCOUNTER — Ambulatory Visit (INDEPENDENT_AMBULATORY_CARE_PROVIDER_SITE_OTHER): Payer: 59 | Admitting: Infectious Diseases

## 2021-05-19 ENCOUNTER — Encounter: Payer: Self-pay | Admitting: Infectious Diseases

## 2021-05-19 VITALS — BP 130/80 | HR 93 | Temp 98.3°F | Wt 218.0 lb

## 2021-05-19 DIAGNOSIS — B20 Human immunodeficiency virus [HIV] disease: Secondary | ICD-10-CM | POA: Diagnosis not present

## 2021-05-19 DIAGNOSIS — G459 Transient cerebral ischemic attack, unspecified: Secondary | ICD-10-CM | POA: Diagnosis not present

## 2021-05-19 DIAGNOSIS — E785 Hyperlipidemia, unspecified: Secondary | ICD-10-CM

## 2021-05-19 DIAGNOSIS — E1149 Type 2 diabetes mellitus with other diabetic neurological complication: Secondary | ICD-10-CM | POA: Diagnosis not present

## 2021-05-19 DIAGNOSIS — E119 Type 2 diabetes mellitus without complications: Secondary | ICD-10-CM | POA: Insufficient documentation

## 2021-05-19 MED ORDER — ATORVASTATIN CALCIUM 10 MG PO TABS: 10.0000 mg | ORAL_TABLET | Freq: Every day | ORAL | 3 refills | Status: AC

## 2021-05-19 MED ORDER — LOSARTAN POTASSIUM 25 MG PO TABS: 25.0000 mg | ORAL_TABLET | Freq: Every day | ORAL | 3 refills | Status: AC

## 2021-05-19 MED ORDER — METFORMIN HCL 850 MG PO TABS
850.0000 mg | ORAL_TABLET | Freq: Two times a day (BID) | ORAL | Status: AC
Start: 2021-05-19 — End: ?

## 2021-05-19 NOTE — Assessment & Plan Note (Signed)
incresased Trig Will watch on statin.

## 2021-05-19 NOTE — Assessment & Plan Note (Signed)
He is doing well, no change in AR Partner on prep No need for condoms Will see him back in 9 months with labs prior.

## 2021-05-19 NOTE — Progress Notes (Signed)
° °  Subjective:    Patient ID: Darrell Estrada, male  DOB: 1961-03-24, 60 y.o.        MRN: [id number]   HPI 60 yo M with hx of hyperlipidemia, nephrolithiasis, DM2 (2022) and AIDS dx 08-2008 [facility] (CD4 84).   Taking atripla until 06-2015 then changed to odesy, effexor.   Had colonoscopy in June 2015- few polyps, all benign. Had repeat done as well. Next shced in 5 years (2025).  Partner on prep.    He had TIA 08-2020- R side of face was dropping. No residual except for slight word finding difficulty. [facility] His blood sugars have been noted to be elevated- was feeling poorly and sleeping all the time. [facility]. FSG was 600+.  Was attributed to his TIA rx. Was started on metformin, fsg at home have been ~140.   I entered his rx in the computer (he did not know exact doses, he will call with correct doses).    HIV 1 RNA Quant  Date Value  05/01/2021 <20 Copies/mL (H)  04/29/2020 <20 Copies/mL  04/02/2019 <20 DETECTED copies/mL (A)   CD4 T Cell Abs (/uL)  Date Value  05/01/2021 296 (L)  04/29/2020 277 (L)  04/02/2019 417     Health Maintenance  Topic Date Due   URINE MICROALBUMIN  Never done   TETANUS/TDAP  Never done   Zoster Vaccines- Shingrix (1 of 2) Never done   COLONOSCOPY (Pts 45-56yrs Insurance coverage will need to be confirmed)  11/20/2023   Pneumococcal Vaccine 29-18 Years old (4 - PPSV23 if available, else PCV20) 06/19/2027   INFLUENZA VACCINE  Completed   COVID-19 Vaccine  Completed   Hepatitis C Screening  Completed   HIV Screening  Completed   HPV VACCINES  Aged Out      Review of Systems  Constitutional:  Negative for chills, fever and weight loss.  Eyes:  Negative for blurred vision.  Respiratory:  Negative for cough and shortness of breath.   Gastrointestinal:  Negative for constipation and diarrhea.  Neurological:  Negative for dizziness, focal weakness and headaches.   Please see HPI. All other systems reviewed and  negative.     Objective:  Physical Exam Vitals reviewed.  Constitutional:      Appearance: Normal appearance.  HENT:     Mouth/Throat:     Mouth: Mucous membranes are moist.     Pharynx: No oropharyngeal exudate.  Eyes:     Extraocular Movements: Extraocular movements intact.     Pupils: Pupils are equal, round, and reactive to light.  Cardiovascular:     Rate and Rhythm: Normal rate and regular rhythm.  Pulmonary:     Effort: Pulmonary effort is normal.     Breath sounds: Normal breath sounds.  Abdominal:     General: Bowel sounds are normal. There is no distension.     Palpations: Abdomen is soft.     Tenderness: There is no abdominal tenderness.  Musculoskeletal:     Cervical back: Normal range of motion and neck supple.     Right lower leg: No edema.     Left lower leg: No edema.  Neurological:     General: No focal deficit present.     Mental Status: He is alert.           Assessment & Plan:

## 2021-05-19 NOTE — Assessment & Plan Note (Signed)
Appreciate his PCP On asa, statin, ace-I.  He appears to be doing well.

## 2021-05-19 NOTE — Assessment & Plan Note (Signed)
Appreciate his PCP On asa, statin, ace-I. metformin He appears to be doing well.  Will check his A1C at f/u.

## 2021-05-23 ENCOUNTER — Encounter: Payer: Self-pay | Admitting: Infectious Diseases

## 2021-06-22 ENCOUNTER — Telehealth: Payer: Self-pay

## 2021-06-22 DIAGNOSIS — B20 Human immunodeficiency virus [HIV] disease: Secondary | ICD-10-CM

## 2021-06-22 MED ORDER — ODEFSEY 200-25-25 MG PO TABS: 1.0000 | ORAL_TABLET | Freq: Every day | ORAL | 5 refills | Status: DC

## 2021-06-22 NOTE — Telephone Encounter (Signed)
Received a call from [facility] regarding patient's new insurnace with [facility]. Patient also informed and will have PCP send maintenance  medications over and [facility] will send [facility] on behalf of Dr. Ninetta Lights. Medications at [facility] have been closed out.  Valarie Cones

## 2021-07-07 ENCOUNTER — Telehealth: Payer: Self-pay

## 2021-07-07 NOTE — Telephone Encounter (Signed)
PA started in covermymeds 07/07/2021 awaiting approval.  Key [id number]  Last Name: Reierson DOB: 08-Dec-1960  Medication: Charlett Lango 200-25-25 MG tablets.

## 2021-07-10 ENCOUNTER — Other Ambulatory Visit (HOSPITAL_COMMUNITY): Payer: Self-pay

## 2021-07-10 NOTE — Telephone Encounter (Signed)
PA for patient Darrell Estrada has been denied. Medication is not on formulary with patient new insurance.  Susanne Borders is covered by [employer]. Please advise.

## 2021-07-11 ENCOUNTER — Encounter: Payer: Self-pay | Admitting: Infectious Diseases

## 2021-07-11 NOTE — Telephone Encounter (Signed)
I spoke to the patient and he stated he only has 5 tablets left of Odefsey and he is ok switching to [employer]. Can he do a phone visit with pharmacy

## 2021-07-13 ENCOUNTER — Encounter: Payer: Self-pay | Admitting: Pharmacist

## 2021-07-13 ENCOUNTER — Other Ambulatory Visit: Payer: Self-pay

## 2021-07-13 ENCOUNTER — Ambulatory Visit (INDEPENDENT_AMBULATORY_CARE_PROVIDER_SITE_OTHER): Payer: 59 | Admitting: Pharmacist

## 2021-07-13 DIAGNOSIS — B20 Human immunodeficiency virus [HIV] disease: Secondary | ICD-10-CM

## 2021-07-13 DIAGNOSIS — R69 Illness, unspecified: Secondary | ICD-10-CM | POA: Diagnosis not present

## 2021-07-13 DIAGNOSIS — Z125 Encounter for screening for malignant neoplasm of prostate: Secondary | ICD-10-CM | POA: Diagnosis not present

## 2021-07-13 DIAGNOSIS — Z79899 Other long term (current) drug therapy: Secondary | ICD-10-CM | POA: Diagnosis not present

## 2021-07-13 DIAGNOSIS — R739 Hyperglycemia, unspecified: Secondary | ICD-10-CM | POA: Diagnosis not present

## 2021-07-13 DIAGNOSIS — E7800 Pure hypercholesterolemia, unspecified: Secondary | ICD-10-CM | POA: Diagnosis not present

## 2021-07-13 MED ORDER — BIKTARVY 50-200-25 MG PO TABS: 1.0000 | ORAL_TABLET | Freq: Every day | ORAL | 11 refills | Status: DC

## 2021-07-13 NOTE — Telephone Encounter (Signed)
Thanks Diminique

## 2021-07-13 NOTE — Telephone Encounter (Signed)
Patient scheduled for a phone visit today with Cassie to discuss medication change

## 2021-07-13 NOTE — Progress Notes (Signed)
Virtual Visit via Telephone Note  I connected with Ronnald Collum on 07/13/21 at  2:30 PM EST by telephone and verified that I am speaking with the correct person using two identifiers.  Location: Patient: Home Provider: Office   I discussed the limitations, risks, security and privacy concerns of performing an evaluation and management service by telephone and the availability of in person appointments. I also discussed with the patient that there may be a patient responsible charge related to this service. The patient expressed understanding and agreed to proceed.  HPI: Darrell Estrada is a 61 y.o. male who is following up to discuss his HIV medication.  Patient Active Problem List   Diagnosis Date Noted   DM2 (diabetes mellitus, type 2) (HCC) 05/19/2021   TIA (transient ischemic attack) 08/26/2020   Obesity (BMI 30-39.9) 04/01/2018   Hyperlipemia 02/20/2010   Depression 10/26/2009   Human immunodeficiency virus (HIV) disease (HCC) 10/25/2009    Patient's Medications  New Prescriptions   BICTEGRAVIR-EMTRICITABINE-TENOFOVIR AF (BIKTARVY) 50-200-25 MG TABS TABLET    Take 1 tablet by mouth daily.  Previous Medications   ASPIRIN 325 MG TABLET    Take 325 mg by mouth daily.   ATORVASTATIN (LIPITOR) 10 MG TABLET    Take 1 tablet (10 mg total) by mouth daily.   LOSARTAN (COZAAR) 25 MG TABLET    Take 1 tablet (25 mg total) by mouth daily.   METFORMIN (GLUCOPHAGE) 850 MG TABLET    Take 1 tablet (850 mg total) by mouth 2 (two) times daily with a meal.   MULTIPLE VITAMIN (MULTIVITAMIN) TABLET    Take 1 tablet by mouth daily.   VENLAFAXINE (EFFEXOR) 75 MG TABLET    Take 75 mg by mouth daily.  Modified Medications   No medications on file  Discontinued Medications   EMTRICITABINE-RILPIVIR-TENOFOVIR AF (ODEFSEY) 200-25-25 MG TABS TABLET    Take 1 tablet by mouth daily with breakfast.    Allergies: Allergies  Allergen Reactions   Codeine     REACTION: panic attacks    Past  Medical History: Past Medical History:  Diagnosis Date   AIDS (acquired immune deficiency syndrome) (HCC) 2010   Hyperlipidemia    Kidney stone    Nephrolithiasis, uric acid     Social History: Social History   Socioeconomic History   Marital status: Single    Spouse name: Not on file   Number of children: Not on file   Years of education: Not on file   Highest education level: Not on file  Occupational History   Not on file  Tobacco Use   Smoking status: Never   Smokeless tobacco: Never   Tobacco comments:    patient does not smoke  Substance and Sexual Activity   Alcohol use: Not Currently    Alcohol/week: 1.0 standard drink    Types: 1 Shots of liquor per week    Comment: rarely   Drug use: No   Sexual activity: Yes    Partners: Male    Comment: pt. declined condoms  Other Topics Concern   Not on file  Social History Narrative   Not on file   Social Determinants of Health   Financial Resource Strain: Not on file  Food Insecurity: Not on file  Transportation Needs: Not on file  Physical Activity: Not on file  Stress: Not on file  Social Connections: Not on file    Labs: Lab Results  Component Value Date   HIV1RNAQUANT <20 (H) 05/01/2021  HIV1RNAQUANT <20 04/29/2020   HIV1RNAQUANT <20 DETECTED (A) 04/02/2019   CD4TABS 296 (L) 05/01/2021   CD4TABS 277 (L) 04/29/2020   CD4TABS 417 04/02/2019    RPR and STI Lab Results  Component Value Date   LABRPR REACTIVE (A) 05/01/2021   LABRPR NON-REACTIVE 04/29/2020   LABRPR REACTIVE (A) 04/02/2019   LABRPR NON-REACTIVE 03/19/2018   LABRPR REACTIVE (A) 03/19/2017   RPRTITER 1:1 (H) 05/01/2021   RPRTITER 1:1 (H) 04/02/2019   RPRTITER 1:1 (H) 03/19/2017    STI Results GC CT  03/19/2017 Negative Negative  02/29/2016 Negative Negative  08/11/2014 NG: Negative CT: Negative    Hepatitis B Lab Results  Component Value Date   HEPBSAB POS (A) 01/21/2013   HEPBSAG NEG 10/25/2009   HEPBCAB POS (A) 10/25/2009    Hepatitis C No results found for: HEPCAB, HCVRNAPCRQN Hepatitis A Lab Results  Component Value Date   HAV NEG 10/25/2009   Lipids: Lab Results  Component Value Date   CHOL 101 05/01/2021   TRIG 209 (H) 05/01/2021   HDL 40 05/01/2021   CHOLHDL 2.5 05/01/2021   VLDL 34 (H) 02/29/2016   LDLCALC 34 05/01/2021    Current HIV Regimen: Odefsey  Assessment: I spoke with Nedra Hai today over the phone. He recently had an insurance change and Charlett Lango is not on his new formulary. The insurance plan requested the patient take Biktarvy.  He tolerates Odefsey well and has been undetectable for years. He is agreeable to switching to [employer].  Discussed how to take Biktarvy - one pill once daily, with or without food. Reviewed his medication list and asked that he separate his multivitamin from the Margaret. He will take the vitamin in the morning and the Biktarvy at night before bed. Counseled on possible side effects but stated that he should tolerate it fine since he tolerates Odefsey without any issues. [facility], so I will send Biktarvy there. Will send him a mychart message with the pharmacy's phone number. Asked him to call or reach out if he needs any copay assistance. Will make a 4 week follow up to assess tolerability and recheck his viral load since he is switching to a new medication. Answered all questions.   Plan: - Stop Odefsey - Start Firth once daily - [facility] - F/u with me on 3/23 at 845am  I discussed the assessment and treatment plan with the patient. The patient was provided an opportunity to ask questions and all were answered. The patient agreed with the plan and demonstrated an understanding of the instructions.   The patient was advised to call back or seek an in-person evaluation if the symptoms worsen or if the condition fails to improve as anticipated.  I provided 20 minutes of non-face-to-face time during this  encounter.  Castin Donaghue L. Caidyn Blossom, PharmD, BCIDP, AAHIVP, CPP Clinical Pharmacist Practitioner Infectious Diseases Clinical Pharmacist [facility] 07/13/2021, 3:27 PM

## 2021-07-19 NOTE — Telephone Encounter (Signed)
Yes, I changed him to [facility] since it was covered by his insurance. I am going to recheck his labs next month!

## 2021-07-20 DIAGNOSIS — Z0000 Encounter for general adult medical examination without abnormal findings: Secondary | ICD-10-CM | POA: Diagnosis not present

## 2021-07-20 DIAGNOSIS — E782 Mixed hyperlipidemia: Secondary | ICD-10-CM | POA: Diagnosis not present

## 2021-07-20 DIAGNOSIS — I10 Essential (primary) hypertension: Secondary | ICD-10-CM | POA: Diagnosis not present

## 2021-07-20 DIAGNOSIS — I38 Endocarditis, valve unspecified: Secondary | ICD-10-CM | POA: Diagnosis not present

## 2021-07-26 ENCOUNTER — Other Ambulatory Visit (HOSPITAL_COMMUNITY): Payer: Self-pay

## 2021-08-16 ENCOUNTER — Ambulatory Visit: Payer: 59 | Admitting: Pharmacist

## 2021-08-17 ENCOUNTER — Telehealth: Payer: Self-pay

## 2021-08-17 ENCOUNTER — Ambulatory Visit: Payer: 59 | Admitting: Pharmacist

## 2021-08-17 NOTE — Telephone Encounter (Signed)
Attempted to contact patient regarding missed appointment this morning for follow up after switching from [facility] to [place]. However, he did not answer and a HIPAA compliant voicemail was left asking patient to call us back at his earliest convince.  ? ?Joseph Art, Pharm.D. ?PGY-1 Pharmacy Resident ?08/17/2021 9:42 AM ?

## 2021-10-17 DIAGNOSIS — E118 Type 2 diabetes mellitus with unspecified complications: Secondary | ICD-10-CM | POA: Diagnosis not present

## 2021-10-17 DIAGNOSIS — G629 Polyneuropathy, unspecified: Secondary | ICD-10-CM | POA: Diagnosis not present

## 2021-10-17 DIAGNOSIS — Z79899 Other long term (current) drug therapy: Secondary | ICD-10-CM | POA: Diagnosis not present

## 2021-11-06 ENCOUNTER — Encounter: Payer: Self-pay | Admitting: Infectious Diseases

## 2022-02-09 DIAGNOSIS — R69 Illness, unspecified: Secondary | ICD-10-CM | POA: Diagnosis not present

## 2022-02-09 DIAGNOSIS — Z79899 Other long term (current) drug therapy: Secondary | ICD-10-CM | POA: Diagnosis not present

## 2022-02-09 DIAGNOSIS — I10 Essential (primary) hypertension: Secondary | ICD-10-CM | POA: Diagnosis not present

## 2022-02-09 DIAGNOSIS — E118 Type 2 diabetes mellitus with unspecified complications: Secondary | ICD-10-CM | POA: Diagnosis not present

## 2022-02-09 DIAGNOSIS — E782 Mixed hyperlipidemia: Secondary | ICD-10-CM | POA: Diagnosis not present

## 2022-02-09 DIAGNOSIS — E669 Obesity, unspecified: Secondary | ICD-10-CM | POA: Diagnosis not present

## 2022-02-23 DIAGNOSIS — Z79899 Other long term (current) drug therapy: Secondary | ICD-10-CM | POA: Diagnosis not present

## 2022-02-23 DIAGNOSIS — E118 Type 2 diabetes mellitus with unspecified complications: Secondary | ICD-10-CM | POA: Diagnosis not present

## 2022-02-23 DIAGNOSIS — I10 Essential (primary) hypertension: Secondary | ICD-10-CM | POA: Diagnosis not present

## 2022-02-23 DIAGNOSIS — E782 Mixed hyperlipidemia: Secondary | ICD-10-CM | POA: Diagnosis not present

## 2022-03-30 ENCOUNTER — Other Ambulatory Visit: Payer: 59

## 2022-03-30 ENCOUNTER — Other Ambulatory Visit (HOSPITAL_COMMUNITY)
Admission: RE | Admit: 2022-03-30 | Discharge: 2022-03-30 | Disposition: A | Discharge status: Home / Self Care | Payer: 59 | Source: Ambulatory Visit | Attending: Internal Medicine | Admitting: Internal Medicine

## 2022-03-30 ENCOUNTER — Other Ambulatory Visit: Payer: Self-pay

## 2022-03-30 ENCOUNTER — Encounter: Payer: Self-pay | Admitting: Internal Medicine

## 2022-03-30 ENCOUNTER — Ambulatory Visit: Payer: 59

## 2022-03-30 DIAGNOSIS — Z113 Encounter for screening for infections with a predominantly sexual mode of transmission: Secondary | ICD-10-CM

## 2022-03-30 DIAGNOSIS — B20 Human immunodeficiency virus [HIV] disease: Secondary | ICD-10-CM | POA: Insufficient documentation

## 2022-03-30 DIAGNOSIS — Z79899 Other long term (current) drug therapy: Secondary | ICD-10-CM | POA: Diagnosis not present

## 2022-03-30 DIAGNOSIS — R69 Illness, unspecified: Secondary | ICD-10-CM | POA: Diagnosis not present

## 2022-04-02 LAB — URINE CYTOLOGY ANCILLARY ONLY
Chlamydia: NEGATIVE
Comment: NEGATIVE
Comment: NORMAL
Neisseria Gonorrhea: NEGATIVE

## 2022-04-04 LAB — CBC WITH DIFFERENTIAL/PLATELET
Absolute Monocytes: 494 cells/uL (ref 200–950)
Basophils Absolute: 21 cells/uL (ref 0–200)
Basophils Relative: 0.4 %
Eosinophils Absolute: 78 cells/uL (ref 15–500)
Eosinophils Relative: 1.5 %
HCT: 46.7 % (ref 38.5–50.0)
Hemoglobin: 16.2 g/dL (ref 13.2–17.1)
Lymphs Abs: 993 cells/uL (ref 850–3900)
MCH: 34.2 pg — ABNORMAL HIGH (ref 27.0–33.0)
MCHC: 34.7 g/dL (ref 32.0–36.0)
MCV: 98.5 fL (ref 80.0–100.0)
MPV: 10.7 fL (ref 7.5–12.5)
Monocytes Relative: 9.5 %
Neutro Abs: 3614 cells/uL (ref 1500–7800)
Neutrophils Relative %: 69.5 %
Platelets: 186 10*3/uL (ref 140–400)
RBC: 4.74 10*6/uL (ref 4.20–5.80)
RDW: 11.4 % (ref 11.0–15.0)
Total Lymphocyte: 19.1 %
WBC: 5.2 10*3/uL (ref 3.8–10.8)

## 2022-04-04 LAB — COMPLETE METABOLIC PANEL WITH GFR
AG Ratio: 1.5 (calc) (ref 1.0–2.5)
ALT: 42 U/L (ref 9–46)
AST: 21 U/L (ref 10–35)
Albumin: 4.6 g/dL (ref 3.6–5.1)
Alkaline phosphatase (APISO): 85 U/L (ref 35–144)
BUN: 18 mg/dL (ref 7–25)
CO2: 26 mmol/L (ref 20–32)
Calcium: 9.8 mg/dL (ref 8.6–10.3)
Chloride: 102 mmol/L (ref 98–110)
Creat: 1.08 mg/dL (ref 0.70–1.30)
Globulin: 3 g/dL (calc) (ref 1.9–3.7)
Glucose, Bld: 247 mg/dL — ABNORMAL HIGH (ref 65–99)
Potassium: 4.1 mmol/L (ref 3.5–5.3)
Sodium: 138 mmol/L (ref 135–146)
Total Bilirubin: 0.9 mg/dL (ref 0.2–1.2)
Total Protein: 7.6 g/dL (ref 6.1–8.1)
eGFR: 79 mL/min/{1.73_m2} (ref >=60)

## 2022-04-04 LAB — LIPID PANEL
Cholesterol: 86 mg/dL (ref <200)
HDL: 43 mg/dL (ref >=40)
LDL Cholesterol (Calc): 22 mg/dL (calc)
Non-HDL Cholesterol (Calc): 43 mg/dL (calc) (ref <130)
Total CHOL/HDL Ratio: 2 (calc) (ref <5.0)
Triglycerides: 120 mg/dL (ref <150)

## 2022-04-04 LAB — T-HELPER CELLS (CD4) COUNT (NOT AT ARMC)
Absolute CD4: 356 cells/uL — ABNORMAL LOW (ref 490–1740)
CD4 T Helper %: 39 % (ref 30–61)
Total lymphocyte count: 913 cells/uL (ref 850–3900)

## 2022-04-04 LAB — HIV-1 RNA QUANT-NO REFLEX-BLD
HIV 1 RNA Quant: NOT DETECTED Copies/mL
HIV-1 RNA Quant, Log: NOT DETECTED Log cps/mL

## 2022-04-04 LAB — RPR: RPR Ser Ql: REACTIVE — AB

## 2022-04-04 LAB — RPR TITER: RPR Titer: 1:1 {titer} — ABNORMAL HIGH

## 2022-04-04 LAB — FLUORESCENT TREPONEMAL AB(FTA)-IGG-BLD: Fluorescent Treponemal ABS: NONREACTIVE

## 2022-04-17 ENCOUNTER — Encounter: Payer: Self-pay | Admitting: Internal Medicine

## 2022-04-17 ENCOUNTER — Other Ambulatory Visit: Payer: Self-pay

## 2022-04-17 ENCOUNTER — Ambulatory Visit (INDEPENDENT_AMBULATORY_CARE_PROVIDER_SITE_OTHER): Payer: 59 | Admitting: Internal Medicine

## 2022-04-17 VITALS — BP 157/93 | HR 77 | Temp 98.4°F | Ht 70.0 in | Wt 222.0 lb

## 2022-04-17 DIAGNOSIS — G459 Transient cerebral ischemic attack, unspecified: Secondary | ICD-10-CM

## 2022-04-17 DIAGNOSIS — I10 Essential (primary) hypertension: Secondary | ICD-10-CM | POA: Diagnosis not present

## 2022-04-17 DIAGNOSIS — B20 Human immunodeficiency virus [HIV] disease: Secondary | ICD-10-CM | POA: Diagnosis not present

## 2022-04-17 DIAGNOSIS — R69 Illness, unspecified: Secondary | ICD-10-CM | POA: Diagnosis not present

## 2022-04-17 DIAGNOSIS — E1149 Type 2 diabetes mellitus with other diabetic neurological complication: Secondary | ICD-10-CM | POA: Diagnosis not present

## 2022-04-17 MED ORDER — BIKTARVY 50-200-25 MG PO TABS: 1.0000 | ORAL_TABLET | Freq: Every day | ORAL | 11 refills | Status: DC

## 2022-04-17 NOTE — Assessment & Plan Note (Signed)
He maintains excellent long term control on current regimen of Biktarvy.  Recent labs were reassuring.  Follow up in 12 months.

## 2022-04-17 NOTE — Progress Notes (Signed)
[facility]   CHIEF COMPLAINT    HIV follow up.    SUBJECTIVE:    Darrell Estrada is a 61 y.o. male with PMHx as below who presents to the clinic for HIV follow up.   Patient is here today for HIV follow up.  Previously followed by Dr Ninetta Lights.  Last seen via virtual last February by [facility] after switching from [place] to [place] due to an insurance switch.  Prior to that his last visit with Dr Ninetta Lights was December 2022.  Per the prior notes, he was diagnosed in 2010.  Prior regimens include: Atripla, Odefsey. No issues with current regimen.  Labs on 03/30/22 were undetectable, CD4 count 356.  Please see A&P for the details of today's visit and status of the patient's medical problems.   Patient's Medications  New Prescriptions   No medications on file  Previous Medications   ASPIRIN 325 MG TABLET    Take 325 mg by mouth daily.   ATORVASTATIN (LIPITOR) 10 MG TABLET    Take 1 tablet (10 mg total) by mouth daily.   LOSARTAN (COZAAR) 25 MG TABLET    Take 1 tablet (25 mg total) by mouth daily.   METFORMIN (GLUCOPHAGE) 850 MG TABLET    Take 1 tablet (850 mg total) by mouth 2 (two) times daily with a meal.   MULTIPLE VITAMIN (MULTIVITAMIN) TABLET    Take 1 tablet by mouth daily.   VENLAFAXINE (EFFEXOR) 75 MG TABLET    Take 75 mg by mouth daily.  Modified Medications   Modified Medication Previous Medication   BICTEGRAVIR-EMTRICITABINE-TENOFOVIR AF (BIKTARVY) 50-200-25 MG TABS TABLET bictegravir-emtricitabine-tenofovir AF (BIKTARVY) 50-200-25 MG TABS tablet      Take 1 tablet by mouth daily.    Take 1 tablet by mouth daily.  Discontinued Medications   No medications on file      Past Medical History:  Diagnosis Date   AIDS (acquired immune deficiency syndrome) (HCC) 2010   Hyperlipidemia    Kidney stone    Nephrolithiasis, uric acid     Social History   Tobacco Use   Smoking status: Never   Smokeless tobacco: Never   Tobacco comments:     patient does not smoke  Substance Use Topics   Alcohol use: Yes    Alcohol/week: 1.0 standard drink of alcohol    Types: 1 Shots of liquor per week    Comment: rarely   Drug use: No    Family History  Problem Relation Age of Onset   Nephrolithiasis Maternal Grandmother    Kidney failure Maternal Grandmother    CVA Mother    CVA Sister     Allergies  Allergen Reactions   Codeine     REACTION: panic attacks    Review of Systems  Constitutional: Negative.   Respiratory: Negative.    Cardiovascular: Negative.   Gastrointestinal: Negative.      OBJECTIVE:    Vitals:   04/17/22 0854  BP: (!) 152/90  Pulse: 77  Temp: 98.4 F (36.9 C)  TempSrc: Oral  SpO2: 97%  Weight: 222 lb (100.7 kg)  Height: 5\' 10"  (1.778 m)     Body mass index is 31.85 kg/m.  Physical Exam Constitutional:      General: He is not in acute distress.    Appearance: Normal appearance.  HENT:     Head: Normocephalic and atraumatic.  Eyes:     Extraocular Movements: Extraocular movements intact.  Conjunctiva/sclera: Conjunctivae normal.  Pulmonary:     Effort: Pulmonary effort is normal. No respiratory distress.  Abdominal:     General: There is no distension.     Palpations: Abdomen is soft.  Musculoskeletal:        General: Normal range of motion.  Skin:    General: Skin is warm and dry.  Neurological:     General: No focal deficit present.     Mental Status: He is alert and oriented to person, place, and time.  Psychiatric:        Mood and Affect: Mood normal.        Behavior: Behavior normal.     Labs and Microbiology:    Latest Ref Rng & Units 03/30/2022    8:58 AM 05/01/2021    9:38 AM 04/29/2020    9:13 AM  CMP  Glucose 65 - 99 mg/dL 937  169  678   BUN 7 - 25 mg/dL 18  21  21    Creatinine 0.70 - 1.30 mg/dL  9.38  1.01   Sodium 135 - 146 mmol/L 138  140  139   Potassium 3.5 - 5.3 mmol/L 4.1  4.8  4.6   Chloride 98 - 110 mmol/L 102  102  101   CO2 20 - 32  mmol/L 26  29  28    Calcium 8.6 - 10.3 mg/dL 9.8  7.51  9.5   Total Protein 6.1 - 8.1 g/dL 7.6  7.4  7.5   Total Bilirubin 0.2 - 1.2 mg/dL 0.9  0.7  0.6   AST 10 - 35 U/L 21  19  19    ALT 9 - 46 U/L 42  27  28       Latest Ref Rng & Units 03/30/2022    8:58 AM 05/01/2021    9:38 AM 04/29/2020    9:13 AM  CBC  WBC 3.8 - 10.8 Thousand/uL 5.2  4.5  4.0   Hemoglobin 13.2 - 17.1 g/dL 13/07/2021  14/09/2020  14/07/2019   Hematocrit 38.5 - 50.0 % 46.7  45.0  46.7   Platelets 140 - 400 Thousand/uL 186  201  186      Lab Results  Component Value Date   HIV1RNAQUANT Not Detected 03/30/2022   HIV1RNAQUANT <20 (H) 05/01/2021   HIV1RNAQUANT <20 04/29/2020   CD4TABS 296 (L) 05/01/2021   CD4TABS 277 (L) 04/29/2020   CD4TABS 417 04/02/2019    RPR and STI: Lab Results  Component Value Date   LABRPR REACTIVE (A) 03/30/2022   LABRPR REACTIVE (A) 05/01/2021   LABRPR NON-REACTIVE 04/29/2020   LABRPR REACTIVE (A) 04/02/2019   LABRPR NON-REACTIVE 03/19/2018   RPRTITER 1:1 (H) 03/30/2022   RPRTITER 1:1 (H) 05/01/2021   RPRTITER 1:1 (H) 04/02/2019   RPRTITER 1:1 (H) 03/19/2017    STI Results GC CT  03/30/2022  9:09 AM Negative  Negative   03/19/2017 12:00 AM Negative  Negative   02/29/2016 12:00 AM Negative  Negative   08/11/2014 12:00 AM NG: Negative  CT: Negative     Hepatitis B: Lab Results  Component Value Date   HEPBSAB POS (A) 01/21/2013   HEPBSAG NEG 10/25/2009   HEPBCAB POS (A) 10/25/2009   Hepatitis C: No results found for: "HEPCAB", "HCVRNAPCRQN" Hepatitis A: Lab Results  Component Value Date   HAV NEG 10/25/2009   Lipids: Lab Results  Component Value Date   CHOL 86 03/30/2022   TRIG 120 03/30/2022   HDL 43 03/30/2022   CHOLHDL  2.0 03/30/2022   VLDL 34 (H) 02/29/2016   LDLCALC 22 03/30/2022    Imaging:    ASSESSMENT & PLAN:    Human immunodeficiency virus (HIV) disease He maintains excellent long term control on current regimen of Biktarvy.  Recent labs were reassuring.   Follow up in 12 months.   DM2 (diabetes mellitus, type 2) (HCC) Followed by his PCP.  Recent A1c was suboptimal with A1c of 8.6 in Sept 2023.  TIA (transient ischemic attack) Follows with PCP and appears to be doing well.   Hypertension BP slightly elevated today on regimen of losartan.  Asymptomatic.  Following with PCP.     Vedia Coffer for Infectious [facility] 04/17/2022, 9:07 AM

## 2022-04-17 NOTE — Assessment & Plan Note (Signed)
BP slightly elevated today on regimen of losartan.  Asymptomatic.  Following with PCP.

## 2022-04-17 NOTE — Assessment & Plan Note (Addendum)
Follows with PCP and appears to be doing well.

## 2022-04-17 NOTE — Assessment & Plan Note (Signed)
Followed by his PCP.  Recent A1c was suboptimal with A1c of 8.6 in Sept 2023.
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
## 2022-05-22 ENCOUNTER — Other Ambulatory Visit: Payer: Self-pay | Admitting: Pharmacist

## 2022-05-22 DIAGNOSIS — B20 Human immunodeficiency virus [HIV] disease: Secondary | ICD-10-CM

## 2022-07-05 ENCOUNTER — Other Ambulatory Visit (HOSPITAL_COMMUNITY): Payer: Self-pay

## 2022-12-25 ENCOUNTER — Ambulatory Visit: Payer: 59

## 2022-12-25 ENCOUNTER — Other Ambulatory Visit: Payer: Self-pay

## 2023-03-27 ENCOUNTER — Other Ambulatory Visit: Payer: Self-pay

## 2023-03-27 DIAGNOSIS — Z113 Encounter for screening for infections with a predominantly sexual mode of transmission: Secondary | ICD-10-CM

## 2023-03-27 DIAGNOSIS — Z79899 Other long term (current) drug therapy: Secondary | ICD-10-CM

## 2023-03-27 DIAGNOSIS — B20 Human immunodeficiency virus [HIV] disease: Secondary | ICD-10-CM

## 2023-04-02 ENCOUNTER — Other Ambulatory Visit: Payer: Self-pay

## 2023-04-02 ENCOUNTER — Other Ambulatory Visit: Payer: 59

## 2023-04-02 ENCOUNTER — Other Ambulatory Visit (HOSPITAL_COMMUNITY)
Admission: RE | Admit: 2023-04-02 | Discharge: 2023-04-02 | Disposition: A | Discharge status: Home / Self Care | Payer: 59 | Source: Ambulatory Visit | Attending: Internal Medicine | Admitting: Internal Medicine

## 2023-04-02 DIAGNOSIS — B20 Human immunodeficiency virus [HIV] disease: Secondary | ICD-10-CM | POA: Diagnosis not present

## 2023-04-02 DIAGNOSIS — Z79899 Other long term (current) drug therapy: Secondary | ICD-10-CM | POA: Diagnosis not present

## 2023-04-02 DIAGNOSIS — Z113 Encounter for screening for infections with a predominantly sexual mode of transmission: Secondary | ICD-10-CM | POA: Diagnosis not present

## 2023-04-03 LAB — URINE CYTOLOGY ANCILLARY ONLY
Chlamydia: NEGATIVE
Comment: NEGATIVE
Comment: NORMAL
Neisseria Gonorrhea: NEGATIVE

## 2023-04-03 LAB — T-HELPER CELL (CD4) - (RCID CLINIC ONLY)
CD4 % Helper T Cell: 35 % (ref 33–65)
CD4 T Cell Abs: 449 /uL (ref 400–1790)

## 2023-04-04 LAB — COMPLETE METABOLIC PANEL WITH GFR
AG Ratio: 1.4 (calc) (ref 1.0–2.5)
ALT: 27 U/L (ref 9–46)
AST: 16 U/L (ref 10–35)
Albumin: 4.7 g/dL (ref 3.6–5.1)
Alkaline phosphatase (APISO): 94 U/L (ref 35–144)
BUN: 22 mg/dL (ref 7–25)
CO2: 31 mmol/L (ref 20–32)
Calcium: 10.7 mg/dL — ABNORMAL HIGH (ref 8.6–10.3)
Chloride: 103 mmol/L (ref 98–110)
Creat: 1.31 mg/dL (ref 0.70–1.35)
Globulin: 3.3 g/dL (ref 1.9–3.7)
Glucose, Bld: 137 mg/dL — ABNORMAL HIGH (ref 65–99)
Potassium: 4.2 mmol/L (ref 3.5–5.3)
Sodium: 144 mmol/L (ref 135–146)
Total Bilirubin: 0.6 mg/dL (ref 0.2–1.2)
Total Protein: 8 g/dL (ref 6.1–8.1)
eGFR: 62 mL/min/{1.73_m2} (ref >=60)

## 2023-04-04 LAB — CBC WITH DIFFERENTIAL/PLATELET
Absolute Lymphocytes: 1500 {cells}/uL (ref 850–3900)
Absolute Monocytes: 666 {cells}/uL (ref 200–950)
Basophils Absolute: 30 {cells}/uL (ref 0–200)
Basophils Relative: 0.5 %
Eosinophils Absolute: 102 {cells}/uL (ref 15–500)
Eosinophils Relative: 1.7 %
HCT: 47.2 % (ref 38.5–50.0)
Hemoglobin: 16.1 g/dL (ref 13.2–17.1)
MCH: 34 pg — ABNORMAL HIGH (ref 27.0–33.0)
MCHC: 34.1 g/dL (ref 32.0–36.0)
MCV: 99.8 fL (ref 80.0–100.0)
MPV: 10.3 fL (ref 7.5–12.5)
Monocytes Relative: 11.1 %
Neutro Abs: 3702 {cells}/uL (ref 1500–7800)
Neutrophils Relative %: 61.7 %
Platelets: 232 10*3/uL (ref 140–400)
RBC: 4.73 10*6/uL (ref 4.20–5.80)
RDW: 11.8 % (ref 11.0–15.0)
Total Lymphocyte: 25 %
WBC: 6 10*3/uL (ref 3.8–10.8)

## 2023-04-04 LAB — LIPID PANEL
Cholesterol: 141 mg/dL (ref <200)
HDL: 48 mg/dL (ref >=40)
LDL Cholesterol (Calc): 69 mg/dL
Non-HDL Cholesterol (Calc): 93 mg/dL (ref <130)
Total CHOL/HDL Ratio: 2.9 (calc) (ref <5.0)
Triglycerides: 165 mg/dL — ABNORMAL HIGH (ref <150)

## 2023-04-04 LAB — T PALLIDUM AB: T Pallidum Abs: NEGATIVE

## 2023-04-04 LAB — HIV-1 RNA QUANT-NO REFLEX-BLD
HIV 1 RNA Quant: NOT DETECTED {copies}/mL
HIV-1 RNA Quant, Log: NOT DETECTED {Log_copies}/mL

## 2023-04-04 LAB — RPR TITER: RPR Titer: 1:1 {titer} — ABNORMAL HIGH

## 2023-04-04 LAB — RPR: RPR Ser Ql: REACTIVE — AB

## 2023-04-13 DIAGNOSIS — H538 Other visual disturbances: Secondary | ICD-10-CM | POA: Diagnosis not present

## 2023-04-13 DIAGNOSIS — R4701 Aphasia: Secondary | ICD-10-CM | POA: Diagnosis not present

## 2023-04-13 DIAGNOSIS — R200 Anesthesia of skin: Secondary | ICD-10-CM | POA: Diagnosis not present

## 2023-04-13 DIAGNOSIS — G8194 Hemiplegia, unspecified affecting left nondominant side: Secondary | ICD-10-CM | POA: Diagnosis not present

## 2023-04-13 DIAGNOSIS — I10 Essential (primary) hypertension: Secondary | ICD-10-CM | POA: Diagnosis not present

## 2023-04-13 DIAGNOSIS — R531 Weakness: Secondary | ICD-10-CM | POA: Diagnosis not present

## 2023-04-13 DIAGNOSIS — B20 Human immunodeficiency virus [HIV] disease: Secondary | ICD-10-CM | POA: Diagnosis not present

## 2023-04-13 DIAGNOSIS — Z21 Asymptomatic human immunodeficiency virus [HIV] infection status: Secondary | ICD-10-CM | POA: Diagnosis not present

## 2023-04-13 DIAGNOSIS — R7303 Prediabetes: Secondary | ICD-10-CM | POA: Diagnosis not present

## 2023-04-13 DIAGNOSIS — R29818 Other symptoms and signs involving the nervous system: Secondary | ICD-10-CM | POA: Diagnosis not present

## 2023-04-13 DIAGNOSIS — I639 Cerebral infarction, unspecified: Secondary | ICD-10-CM | POA: Diagnosis not present

## 2023-04-13 DIAGNOSIS — R202 Paresthesia of skin: Secondary | ICD-10-CM | POA: Diagnosis not present

## 2023-04-14 DIAGNOSIS — E785 Hyperlipidemia, unspecified: Secondary | ICD-10-CM | POA: Diagnosis not present

## 2023-04-14 DIAGNOSIS — G459 Transient cerebral ischemic attack, unspecified: Secondary | ICD-10-CM | POA: Diagnosis not present

## 2023-04-14 DIAGNOSIS — I10 Essential (primary) hypertension: Secondary | ICD-10-CM | POA: Diagnosis not present

## 2023-04-14 DIAGNOSIS — R531 Weakness: Secondary | ICD-10-CM | POA: Diagnosis not present

## 2023-04-14 DIAGNOSIS — Z7984 Long term (current) use of oral hypoglycemic drugs: Secondary | ICD-10-CM | POA: Diagnosis not present

## 2023-04-14 DIAGNOSIS — E119 Type 2 diabetes mellitus without complications: Secondary | ICD-10-CM | POA: Diagnosis not present

## 2023-04-14 DIAGNOSIS — Z794 Long term (current) use of insulin: Secondary | ICD-10-CM | POA: Diagnosis not present

## 2023-04-14 DIAGNOSIS — R202 Paresthesia of skin: Secondary | ICD-10-CM | POA: Diagnosis not present

## 2023-04-16 ENCOUNTER — Encounter: Payer: Self-pay | Admitting: Internal Medicine

## 2023-04-16 ENCOUNTER — Ambulatory Visit: Payer: 59 | Admitting: Internal Medicine

## 2023-04-16 ENCOUNTER — Other Ambulatory Visit: Payer: Self-pay

## 2023-04-16 ENCOUNTER — Ambulatory Visit: Payer: Self-pay | Admitting: Internal Medicine

## 2023-04-16 VITALS — BP 150/95 | HR 98 | Ht 69.0 in | Wt 207.0 lb

## 2023-04-16 DIAGNOSIS — Z23 Encounter for immunization: Secondary | ICD-10-CM

## 2023-04-16 DIAGNOSIS — E785 Hyperlipidemia, unspecified: Secondary | ICD-10-CM | POA: Diagnosis not present

## 2023-04-16 DIAGNOSIS — B20 Human immunodeficiency virus [HIV] disease: Secondary | ICD-10-CM

## 2023-04-16 DIAGNOSIS — Z113 Encounter for screening for infections with a predominantly sexual mode of transmission: Secondary | ICD-10-CM | POA: Diagnosis not present

## 2023-04-16 MED ORDER — BIKTARVY 50-200-25 MG PO TABS: 1.0000 | ORAL_TABLET | Freq: Every day | ORAL | 11 refills | Status: DC

## 2023-04-16 NOTE — Assessment & Plan Note (Signed)
He is doing well on biktarvy and no changes indicated.  Feels well and labs reviewed with him Refills provided. Follow up in 11 months

## 2023-04-16 NOTE — Progress Notes (Signed)
   Subjective:    Patient ID: Darrell Estrada, male    DOB: 1960-12-09, 62 y.o.   MRN: [id number]  HPI Nedra Hai is here for follow up of HIV He continues on Biktarvy which was changed last year due to insurance issues and no missed doses.  No issues with getting or taking his medication.  Had symptoms of a TIA and seen in the ED at [facility].  REsolved now.   Has had anal pap screening by GI during colonoscopies with one coming up.     Review of Systems  Constitutional:  Negative for fatigue.  Gastrointestinal:  Negative for diarrhea.  Skin:  Negative for rash.       Objective:   Physical Exam Eyes:     General: No scleral icterus. Pulmonary:     Effort: Pulmonary effort is normal.  Neurological:     Mental Status: He is alert.   SH: no tobacco        Assessment & Plan:

## 2023-04-16 NOTE — Assessment & Plan Note (Signed)
Screened negative 

## 2023-04-16 NOTE — Assessment & Plan Note (Signed)
On a statin and will defer lipid panels to his pcp

## 2023-05-23 DIAGNOSIS — J019 Acute sinusitis, unspecified: Secondary | ICD-10-CM | POA: Diagnosis not present

## 2023-05-23 DIAGNOSIS — R0781 Pleurodynia: Secondary | ICD-10-CM | POA: Diagnosis not present

## 2023-09-12 ENCOUNTER — Other Ambulatory Visit (HOSPITAL_COMMUNITY): Payer: Self-pay

## 2023-09-27 ENCOUNTER — Telehealth: Payer: Self-pay

## 2023-09-27 NOTE — Telephone Encounter (Signed)
 Patient scheduled to follow up with Trevor Fudge, NP 10/01/23  to discuss switching to Dovato due to medication coverage with Biktarvy .  Patient has 9 days of Biktarvy  left.  Reshma Hoey Adel Holt, CMA

## 2023-10-01 ENCOUNTER — Telehealth: Payer: Self-pay | Admitting: Infectious Diseases

## 2023-10-01 ENCOUNTER — Other Ambulatory Visit: Payer: Self-pay

## 2023-10-01 DIAGNOSIS — B20 Human immunodeficiency virus [HIV] disease: Secondary | ICD-10-CM

## 2023-10-01 MED ORDER — DOVATO 50-300 MG PO TABS: 1.0000 | ORAL_TABLET | Freq: Every day | ORAL | 11 refills | Status: AC

## 2023-10-01 NOTE — Progress Notes (Unsigned)
 Name: Darrell Estrada  DOB: 1960-09-29 MRN: [id number] PCP: Yehuda Helms, MD   VIRTUAL CARE ENCOUNTER  I connected with Dusty Gin on 10/03/23 at  4:00 PM EDT by VIDEO and verified that I am speaking with the correct person using two identifiers.   I discussed the limitations, risks, security and privacy concerns of performing an evaluation and management service by telephone and the availability of in person appointments. I also discussed with the patient that there may be a patient responsible charge related to this service. The patient expressed understanding and agreed to proceed.  Patient Location:  residence  Other Participants:   Provider Location: [facility] Office    Brief Narrative:  Darrell Estrada is a 63 y.o. male with HIV, Stage 3/AIDS, diagnosed on 08-2008. CD4 nadir 84. H/O TIA 2022. T2DM.  VL unknown Transmission Risk: sexual  History of OIs: none known History of STIs: Hep B sAg (-), sAb (+), cAb (-); Hep A (+), Hep C (-) Quantiferon (-) HLA B*5701 () G6PD: ()   Previous Regimens: Atripla until 2017 Odefsey  2017 - 2023 (suppressed, required insurance change)  Biktarvy  2023 - 2025 (suppressed, required insurance change)  Dovato 2025   Genotypes: 2012 - unable to run with undetectable level  Subjective   Subjective:  CC: ARV medication change due to insurance coverage.     Discussed the use of AI scribe software for clinical note transcription with the patient, who gave verbal consent to proceed.  History of Present Illness   Darrell Estrada "Merlyn Starring" is a 63 year old male with HIV who presents for a medication change due to insurance requirements.  He is currently on Biktarvy  for HIV treatment, having switched from Odefsey  a few years ago due to insurance requirements. He has been stable on Biktarvy  but now needs to transition to Dovato, another antiretroviral medication, due to similar insurance mandates.  He has not experienced  significant side effects from Biktarvy . There is a concern for potential initial side effects with Dovato, such as sleep disturbances, gastrointestinal issues like diarrhea or increased bowel movements, and headaches. These side effects are expected to resolve within three to four weeks.  He is also taking metformin  850 mg for type 2 diabetes management.  He works at [employer], which involves flexible and demanding work hours.      LOV with Dr. Seymour Dapper 03/2023 - tolerating biktarvy  well, required medication change d/t insurance as well.       04/16/2023    8:49 AM  Depression screen PHQ 2/9  Decreased Interest 0  Down, Depressed, Hopeless 0  PHQ - 2 Score 0    ROS  Past Medical History:  Diagnosis Date   AIDS (acquired immune deficiency syndrome) (HCC) 2010   Hyperlipidemia    Kidney stone    Nephrolithiasis, uric acid     Outpatient Medications Prior to Visit  Medication Sig Dispense Refill   aspirin 325 MG tablet Take 325 mg by mouth daily.     atorvastatin  (LIPITOR) 10 MG tablet Take 1 tablet (10 mg total) by mouth daily. 30 tablet 3   losartan  (COZAAR ) 25 MG tablet Take 1 tablet (25 mg total) by mouth daily. 30 tablet 3   metFORMIN  (GLUCOPHAGE ) 850 MG tablet Take 1 tablet (850 mg total) by mouth 2 (two) times daily with a meal. 90 tablet    Multiple Vitamin (MULTIVITAMIN) tablet Take 1 tablet by mouth daily.     venlafaxine  (EFFEXOR ) 75 MG tablet Take  75 mg by mouth daily.     bictegravir-emtricitabine-tenofovir  AF (BIKTARVY ) 50-200-25 MG TABS tablet Take 1 tablet by mouth daily. 30 tablet 11   No facility-administered medications prior to visit.     Allergies  Allergen Reactions   Codeine     REACTION: panic attacks    Social History   Tobacco Use   Smoking status: Never   Smokeless tobacco: Never   Tobacco comments:    patient does not smoke  Substance Use Topics   Alcohol use: Yes    Alcohol/week: 1.0 standard drink of alcohol    Types: 1 Shots of liquor per  week    Comment: rarely   Drug use: No    Social History   Substance and Sexual Activity  Sexual Activity Yes   Partners: Male   Comment: declined condoms      Objective   Objective:  There were no vitals filed for this visit. There is no height or weight on file to calculate BMI.  Physical Exam     Assessment & Plan:     HIV infection -  HIV infection well managed with Biktarvy . Insurance requires a switch - I reviewed previous ID notes/regimens and genotypes and hepatitis b immunity - Dovato is a fine choice. Potential lead-in side effects include sleep interruption, gastrointestinal issues, and headaches, typically resolving within 3-4 weeks. Dovato should not be taken with vitamins or supplements containing calcium , iron, or aluminum.  Will plan a repeat viral load in 6-8 weeks to ensure working well.  - Discontinue Biktarvy  and initiate Dovato once received. - Instruct to take Dovato in the evening, with or without food. - Advise to take vitamins or supplements at least six hours apart from Dovato. - Send Dovato prescription to [facility] pharmacy in [place]. - Provide two-week sample of Dovato if there is a delay in insurance coverage. - Recheck viral load 1-2 months after medication switch.  Type 2 diabetes mellitus Type 2 diabetes mellitus managed with metformin  850 mg. Monitor for potential increase in side effects due to medication interaction, such as muscle aches or twitching. - Continue metformin  850 mg as prescribed. - Monitor for muscle aches, twitching, or unexplained shaking and report if he occurs. - Include BMP / lactate monitoring in lab follow up.        Orders Placed This Encounter  Procedures   HIV 1 RNA quant-no reflex-bld    Standing Status:   Future    Expected Date:   11/12/2023    Expiration Date:   09/30/2024   Lactic acid, plasma    Standing Status:   Future    Expected Date:   11/12/2023    Expiration Date:   09/30/2024   Basic metabolic  panel with GFR    Standing Status:   Future    Expected Date:   11/12/2023    Expiration Date:   09/30/2024    Has the patient fasted?:   No    Meds ordered this encounter  Medications   dolutegravir-lamiVUDine (DOVATO) 50-300 MG tablet    Sig: Take 1 tablet by mouth daily.    Dispense:  30 tablet    Refill:  11    Supervising Provider:   Levern Reader, CYNTHIA [[id number]]    Gibson Kurtz, MSN, NP-C [facility]  [email or web address] Pager: [phone] Office: [phone] [facility] Main Line: [phone] *Secure Chat Communication Welcome

## 2023-10-03 ENCOUNTER — Encounter: Payer: Self-pay | Admitting: Infectious Diseases

## 2023-10-07 NOTE — Progress Notes (Signed)
 The ASCVD Risk score (Arnett DK, et al., 2019) failed to calculate for the following reasons:   The valid total cholesterol range is 130 to 320 mg/dL  Darrell Estrada, BSN, RN

## 2023-12-17 ENCOUNTER — Other Ambulatory Visit

## 2023-12-17 ENCOUNTER — Other Ambulatory Visit: Payer: Self-pay

## 2023-12-17 DIAGNOSIS — B20 Human immunodeficiency virus [HIV] disease: Secondary | ICD-10-CM

## 2023-12-19 LAB — BASIC METABOLIC PANEL WITH GFR
BUN: 24 mg/dL (ref 7–25)
CO2: 28 mmol/L (ref 20–32)
Calcium: 9.4 mg/dL (ref 8.6–10.3)
Chloride: 102 mmol/L (ref 98–110)
Creat: 1.24 mg/dL (ref 0.70–1.35)
Glucose, Bld: 124 mg/dL — ABNORMAL HIGH (ref 65–99)
Potassium: 4.2 mmol/L (ref 3.5–5.3)
Sodium: 138 mmol/L (ref 135–146)
eGFR: 66 mL/min/1.73m2 (ref >=60)

## 2023-12-19 LAB — HIV-1 RNA QUANT-NO REFLEX-BLD
HIV 1 RNA Quant: NOT DETECTED {copies}/mL
HIV-1 RNA Quant, Log: NOT DETECTED {Log_copies}/mL

## 2023-12-19 LAB — LACTIC ACID, PLASMA: LACTIC ACID: 2.1 mmol/L — ABNORMAL HIGH (ref 0.4–1.8)

## 2023-12-23 ENCOUNTER — Ambulatory Visit: Payer: Self-pay | Admitting: Infectious Diseases

## 2023-12-23 NOTE — Progress Notes (Signed)
 Left vm requesting call back. Julien Odor, RMA

## 2023-12-27 NOTE — Telephone Encounter (Signed)
 Spoke with Jama, relayed that viral load undetectable on Dovato .   Discussed mildly elevated lactic acid and interaction between Dovato  and metformin . Relayed that Corean recommends stopping the metformin  and reaching out to his PCP to discuss alternative blood glucose management that would work better with the Dovato .   Notified him that [place] also sent a message to Dr. Auston.   Scheduled for 6 month follow up.   Patient verbalized understanding and has no further questions.   Davy Westmoreland, BSN, RN

## 2024-03-03 ENCOUNTER — Other Ambulatory Visit: Payer: 59

## 2024-03-17 ENCOUNTER — Ambulatory Visit: Payer: Self-pay | Admitting: Internal Medicine

## 2024-07-27 ENCOUNTER — Ambulatory Visit: Admitting: Infectious Diseases
